# Patient Record
Sex: Male | Born: 1955 | Race: White | Hispanic: No | Marital: Married | Smoking: Never smoker
Health system: Southern US, Community
[De-identification: ages and names within clinical notes are randomized; demographics above are authoritative.]

## PROBLEM LIST (undated history)

## (undated) DIAGNOSIS — R351 Nocturia: Secondary | ICD-10-CM

## (undated) DIAGNOSIS — Z87442 Personal history of urinary calculi: Secondary | ICD-10-CM

## (undated) DIAGNOSIS — N201 Calculus of ureter: Secondary | ICD-10-CM

## (undated) DIAGNOSIS — I10 Essential (primary) hypertension: Secondary | ICD-10-CM

## (undated) DIAGNOSIS — Z85828 Personal history of other malignant neoplasm of skin: Secondary | ICD-10-CM

## (undated) DIAGNOSIS — K219 Gastro-esophageal reflux disease without esophagitis: Secondary | ICD-10-CM

## (undated) DIAGNOSIS — Z9889 Other specified postprocedural states: Secondary | ICD-10-CM

## (undated) DIAGNOSIS — C801 Malignant (primary) neoplasm, unspecified: Secondary | ICD-10-CM

## (undated) DIAGNOSIS — M109 Gout, unspecified: Secondary | ICD-10-CM

## (undated) DIAGNOSIS — M1990 Unspecified osteoarthritis, unspecified site: Secondary | ICD-10-CM

## (undated) DIAGNOSIS — Z8616 Personal history of COVID-19: Secondary | ICD-10-CM

---

## 2016-12-28 ENCOUNTER — Other Ambulatory Visit: Payer: Self-pay | Admitting: Orthopedic Surgery

## 2016-12-28 DIAGNOSIS — M25312 Other instability, left shoulder: Secondary | ICD-10-CM

## 2016-12-28 DIAGNOSIS — S46092A Other injury of muscle(s) and tendon(s) of the rotator cuff of left shoulder, initial encounter: Secondary | ICD-10-CM

## 2016-12-28 DIAGNOSIS — G8911 Acute pain due to trauma: Secondary | ICD-10-CM

## 2016-12-28 DIAGNOSIS — M25512 Pain in left shoulder: Secondary | ICD-10-CM

## 2017-01-11 ENCOUNTER — Ambulatory Visit
Admission: RE | Admit: 2017-01-11 | Discharge: 2017-01-11 | Disposition: A | Discharge status: Home / Self Care | Payer: Managed Care, Other (non HMO) | Source: Ambulatory Visit | Attending: Orthopedic Surgery | Admitting: Orthopedic Surgery

## 2017-01-11 DIAGNOSIS — M25512 Pain in left shoulder: Secondary | ICD-10-CM | POA: Diagnosis not present

## 2017-01-11 DIAGNOSIS — S46012A Strain of muscle(s) and tendon(s) of the rotator cuff of left shoulder, initial encounter: Secondary | ICD-10-CM | POA: Insufficient documentation

## 2017-01-11 DIAGNOSIS — S46092A Other injury of muscle(s) and tendon(s) of the rotator cuff of left shoulder, initial encounter: Secondary | ICD-10-CM | POA: Diagnosis present

## 2017-01-11 DIAGNOSIS — M75122 Complete rotator cuff tear or rupture of left shoulder, not specified as traumatic: Secondary | ICD-10-CM | POA: Diagnosis not present

## 2017-01-11 DIAGNOSIS — G8911 Acute pain due to trauma: Secondary | ICD-10-CM | POA: Insufficient documentation

## 2017-01-11 DIAGNOSIS — M25312 Other instability, left shoulder: Secondary | ICD-10-CM

## 2017-03-28 ENCOUNTER — Inpatient Hospital Stay: Admission: RE | Admit: 2017-03-28 | Payer: Managed Care, Other (non HMO) | Source: Ambulatory Visit

## 2017-03-29 ENCOUNTER — Inpatient Hospital Stay: Admission: RE | Admit: 2017-03-29 | Payer: Managed Care, Other (non HMO) | Source: Ambulatory Visit

## 2017-04-01 ENCOUNTER — Encounter
Admission: RE | Admit: 2017-04-01 | Discharge: 2017-04-01 | Disposition: A | Discharge status: Home / Self Care | Payer: Managed Care, Other (non HMO) | Source: Ambulatory Visit | Attending: Surgery | Admitting: Surgery

## 2017-04-01 HISTORY — DX: Unspecified osteoarthritis, unspecified site: M19.90

## 2017-04-01 HISTORY — DX: Personal history of urinary calculi: Z87.442

## 2017-04-01 HISTORY — DX: Malignant (primary) neoplasm, unspecified: C80.1

## 2017-04-01 HISTORY — DX: Essential (primary) hypertension: I10

## 2017-04-01 NOTE — Patient Instructions (Signed)
  Your procedure is scheduled on: 04-04-17 THURSDAY Report to Same Day Surgery 2nd floor medical mall [facility] Entrance-take elevator on left to 2nd floor.  Check in with surgery information desk.) To find out your arrival time please call [phone] between 1PM - 3PM on 04-03-17 [facility]  Remember: Instructions that are not followed completely may result in serious medical risk, up to and including death, or upon the discretion of your surgeon and anesthesiologist your surgery may need to be rescheduled.    _x___ 1. Do not eat food or drink liquids after midnight. No gum chewing or hard candies.     __x__ 2. No Alcohol for 24 hours before or after surgery.   __x__3. No Smoking for 24 prior to surgery.   ____  4. Bring all medications with you on the day of surgery if instructed.    __x__ 5. Notify your doctor if there is any change in your medical condition     (cold, fever, infections).     Do not wear jewelry, make-up, hairpins, clips or nail polish.  Do not wear lotions, powders, or perfumes. You may wear deodorant.  Do not shave 48 hours prior to surgery. Men may shave face and neck.  Do not bring valuables to the hospital.    [facility] is not responsible for any belongings or valuables.               Contacts, dentures or bridgework may not be worn into surgery.  Leave your suitcase in the car. After surgery it may be brought to your room.  For patients admitted to the hospital, discharge time is determined by your treatment team.   Patients discharged the day of surgery will not be allowed to drive home.  You will need someone to drive you home and stay with you the night of your procedure.    Please read over the following fact sheets that you were given:     _x___ [place] WITH A SMALL SIP OF WATER. These include:  1. LISINOPRIL  2.  3.  4.  5.  6.  ____Fleets enema or Magnesium Citrate as directed.   _x___ Use  CHG Soap or sage wipes as directed on instruction sheet   ____ Use inhalers on the day of surgery and bring to hospital day of surgery  ____ Stop Metformin and Janumet 2 days prior to surgery.    ____ Take 1/2 of usual insulin dose the night before surgery and none on the morning surgery.   ____ Follow recommendations from Cardiologist, Pulmonologist or PCP regarding stopping Aspirin, Coumadin, Pllavix ,Eliquis, Effient, or Pradaxa, and Pletal.  X____Stop Anti-inflammatories such as Advil, Aleve, IBUPROFEN, Motrin, Naproxen, Naprosyn, Goodies powders or aspirin products NOW-OK to take Tylenol    ____ Stop supplements until after surgery.     ____ Bring C-Pap to the hospital.

## 2017-04-02 ENCOUNTER — Encounter
Admission: RE | Admit: 2017-04-02 | Discharge: 2017-04-02 | Disposition: A | Discharge status: Home / Self Care | Payer: Managed Care, Other (non HMO) | Source: Ambulatory Visit | Attending: Surgery | Admitting: Surgery

## 2017-04-02 DIAGNOSIS — Z85828 Personal history of other malignant neoplasm of skin: Secondary | ICD-10-CM | POA: Diagnosis not present

## 2017-04-02 DIAGNOSIS — W1809XA Striking against other object with subsequent fall, initial encounter: Secondary | ICD-10-CM | POA: Diagnosis not present

## 2017-04-02 DIAGNOSIS — Y9389 Activity, other specified: Secondary | ICD-10-CM | POA: Diagnosis not present

## 2017-04-02 DIAGNOSIS — Z91013 Allergy to seafood: Secondary | ICD-10-CM | POA: Diagnosis not present

## 2017-04-02 DIAGNOSIS — M65812 Other synovitis and tenosynovitis, left shoulder: Secondary | ICD-10-CM | POA: Diagnosis not present

## 2017-04-02 DIAGNOSIS — I10 Essential (primary) hypertension: Secondary | ICD-10-CM | POA: Diagnosis not present

## 2017-04-02 DIAGNOSIS — Y92003 Bedroom of unspecified non-institutional (private) residence as the place of occurrence of the external cause: Secondary | ICD-10-CM | POA: Diagnosis not present

## 2017-04-02 DIAGNOSIS — S46012A Strain of muscle(s) and tendon(s) of the rotator cuff of left shoulder, initial encounter: Secondary | ICD-10-CM | POA: Diagnosis not present

## 2017-04-02 DIAGNOSIS — M7542 Impingement syndrome of left shoulder: Secondary | ICD-10-CM | POA: Diagnosis present

## 2017-04-02 DIAGNOSIS — Y998 Other external cause status: Secondary | ICD-10-CM | POA: Diagnosis not present

## 2017-04-02 DIAGNOSIS — Z79899 Other long term (current) drug therapy: Secondary | ICD-10-CM | POA: Diagnosis not present

## 2017-04-02 DIAGNOSIS — M7522 Bicipital tendinitis, left shoulder: Secondary | ICD-10-CM | POA: Diagnosis not present

## 2017-04-02 DIAGNOSIS — Z87442 Personal history of urinary calculi: Secondary | ICD-10-CM | POA: Diagnosis not present

## 2017-04-03 MED ORDER — CEFAZOLIN SODIUM-DEXTROSE 2-4 GM/100ML-% IV SOLN
2.0000 g | Freq: Once | INTRAVENOUS | Status: AC
  Administered 2017-04-04: 2 g via INTRAVENOUS

## 2017-04-04 ENCOUNTER — Encounter: Payer: Self-pay | Admitting: Certified Registered Nurse Anesthetist

## 2017-04-04 ENCOUNTER — Encounter
Admission: RE | Disposition: A | Discharge status: Home / Self Care | Payer: Self-pay | Source: Ambulatory Visit | Attending: Surgery

## 2017-04-04 ENCOUNTER — Ambulatory Visit
Admission: RE | Admit: 2017-04-04 | Discharge: 2017-04-04 | Disposition: A | Discharge status: Home / Self Care | Payer: Managed Care, Other (non HMO) | Source: Ambulatory Visit | Attending: Surgery | Admitting: Surgery

## 2017-04-04 ENCOUNTER — Ambulatory Visit: Payer: Managed Care, Other (non HMO) | Admitting: Certified Registered Nurse Anesthetist

## 2017-04-04 DIAGNOSIS — Z79899 Other long term (current) drug therapy: Secondary | ICD-10-CM | POA: Insufficient documentation

## 2017-04-04 DIAGNOSIS — Z91013 Allergy to seafood: Secondary | ICD-10-CM | POA: Insufficient documentation

## 2017-04-04 DIAGNOSIS — Z87442 Personal history of urinary calculi: Secondary | ICD-10-CM | POA: Insufficient documentation

## 2017-04-04 DIAGNOSIS — Y998 Other external cause status: Secondary | ICD-10-CM | POA: Insufficient documentation

## 2017-04-04 DIAGNOSIS — W1809XA Striking against other object with subsequent fall, initial encounter: Secondary | ICD-10-CM | POA: Insufficient documentation

## 2017-04-04 DIAGNOSIS — M65812 Other synovitis and tenosynovitis, left shoulder: Secondary | ICD-10-CM | POA: Insufficient documentation

## 2017-04-04 DIAGNOSIS — Y92003 Bedroom of unspecified non-institutional (private) residence as the place of occurrence of the external cause: Secondary | ICD-10-CM | POA: Insufficient documentation

## 2017-04-04 DIAGNOSIS — S46012A Strain of muscle(s) and tendon(s) of the rotator cuff of left shoulder, initial encounter: Secondary | ICD-10-CM | POA: Insufficient documentation

## 2017-04-04 DIAGNOSIS — Y9389 Activity, other specified: Secondary | ICD-10-CM | POA: Insufficient documentation

## 2017-04-04 DIAGNOSIS — M7542 Impingement syndrome of left shoulder: Secondary | ICD-10-CM | POA: Insufficient documentation

## 2017-04-04 DIAGNOSIS — M7522 Bicipital tendinitis, left shoulder: Secondary | ICD-10-CM | POA: Insufficient documentation

## 2017-04-04 DIAGNOSIS — Z85828 Personal history of other malignant neoplasm of skin: Secondary | ICD-10-CM | POA: Insufficient documentation

## 2017-04-04 DIAGNOSIS — I10 Essential (primary) hypertension: Secondary | ICD-10-CM | POA: Insufficient documentation

## 2017-04-04 HISTORY — PX: SHOULDER ARTHROSCOPY WITH OPEN ROTATOR CUFF REPAIR: SHX6092

## 2017-04-04 HISTORY — PX: BICEPT TENODESIS: SHX5116

## 2017-04-04 SURGERY — ARTHROSCOPY, SHOULDER WITH REPAIR, ROTATOR CUFF, OPEN
Anesthesia: General | Site: Shoulder | Laterality: Left | Wound class: Clean

## 2017-04-04 MED ORDER — LIDOCAINE HCL (PF) 2 % IJ SOLN
INTRAMUSCULAR | Status: AC
  Filled 2017-04-04: qty 2

## 2017-04-04 MED ORDER — ACETAMINOPHEN 10 MG/ML IV SOLN
INTRAVENOUS | Status: DC | PRN
  Administered 2017-04-04: 1000 mg via INTRAVENOUS

## 2017-04-04 MED ORDER — ONDANSETRON HCL 4 MG/2ML IJ SOLN
INTRAMUSCULAR | Status: DC | PRN
  Administered 2017-04-04: 4 mg via INTRAVENOUS

## 2017-04-04 MED ORDER — BUPIVACAINE-EPINEPHRINE (PF) 0.5% -1:200000 IJ SOLN
INTRAMUSCULAR | Status: AC
  Filled 2017-04-04: qty 30

## 2017-04-04 MED ORDER — SUCCINYLCHOLINE CHLORIDE 20 MG/ML IJ SOLN
INTRAMUSCULAR | Status: DC | PRN
  Administered 2017-04-04: 100 mg via INTRAVENOUS

## 2017-04-04 MED ORDER — LACTATED RINGERS IV SOLN
INTRAVENOUS | Status: DC
  Administered 2017-04-04: 09:00:00 via INTRAVENOUS

## 2017-04-04 MED ORDER — PROPOFOL 10 MG/ML IV BOLUS
INTRAVENOUS | Status: DC | PRN
Start: 2017-04-04 — End: 2017-04-04
  Administered 2017-04-04: 150 mg via INTRAVENOUS
  Administered 2017-04-04: 50 mg via INTRAVENOUS

## 2017-04-04 MED ORDER — EPINEPHRINE PF 1 MG/ML IJ SOLN
INTRAMUSCULAR | Status: AC
  Filled 2017-04-04: qty 2

## 2017-04-04 MED ORDER — LIDOCAINE HCL (PF) 1 % IJ SOLN
INTRAMUSCULAR | Status: AC
  Filled 2017-04-04: qty 5

## 2017-04-04 MED ORDER — ONDANSETRON HCL 4 MG/2ML IJ SOLN: 4.0000 mg | Freq: Once | INTRAMUSCULAR | Status: DC | PRN

## 2017-04-04 MED ORDER — ACETAMINOPHEN 10 MG/ML IV SOLN
INTRAVENOUS | Status: AC
  Filled 2017-04-04: qty 100

## 2017-04-04 MED ORDER — ROCURONIUM BROMIDE 50 MG/5ML IV SOLN
INTRAVENOUS | Status: AC
  Filled 2017-04-04: qty 1

## 2017-04-04 MED ORDER — OXYCODONE HCL 5 MG PO TABS: 5.0000 mg | ORAL_TABLET | ORAL | 0 refills | Status: DC | PRN

## 2017-04-04 MED ORDER — PHENYLEPHRINE HCL 10 MG/ML IJ SOLN
INTRAMUSCULAR | Status: DC | PRN
  Administered 2017-04-04 (×3): 100 ug via INTRAVENOUS

## 2017-04-04 MED ORDER — SUGAMMADEX SODIUM 200 MG/2ML IV SOLN
INTRAVENOUS | Status: DC | PRN
  Administered 2017-04-04: 250 mg via INTRAVENOUS

## 2017-04-04 MED ORDER — LIDOCAINE HCL (CARDIAC) 20 MG/ML IV SOLN
INTRAVENOUS | Status: DC | PRN
  Administered 2017-04-04: 80 mg via INTRAVENOUS

## 2017-04-04 MED ORDER — POTASSIUM CHLORIDE IN NACL 20-0.9 MEQ/L-% IV SOLN
INTRAVENOUS | Status: DC
  Filled 2017-04-04: qty 1000

## 2017-04-04 MED ORDER — ONDANSETRON HCL 4 MG/2ML IJ SOLN
INTRAMUSCULAR | Status: AC
  Filled 2017-04-04: qty 2

## 2017-04-04 MED ORDER — BUPIVACAINE-EPINEPHRINE 0.5% -1:200000 IJ SOLN
INTRAMUSCULAR | Status: DC | PRN
  Administered 2017-04-04: 30 mL

## 2017-04-04 MED ORDER — ONDANSETRON HCL 4 MG/2ML IJ SOLN: 4.0000 mg | Freq: Four times a day (QID) | INTRAMUSCULAR | Status: DC | PRN

## 2017-04-04 MED ORDER — DEXAMETHASONE SODIUM PHOSPHATE 10 MG/ML IJ SOLN
INTRAMUSCULAR | Status: DC | PRN
  Administered 2017-04-04: 10 mg via INTRAVENOUS

## 2017-04-04 MED ORDER — ONDANSETRON HCL 4 MG PO TABS: 4.0000 mg | ORAL_TABLET | Freq: Four times a day (QID) | ORAL | Status: DC | PRN

## 2017-04-04 MED ORDER — FAMOTIDINE 20 MG PO TABS
20.0000 mg | ORAL_TABLET | Freq: Once | ORAL | Status: AC
  Administered 2017-04-04: 20 mg via ORAL

## 2017-04-04 MED ORDER — SODIUM CHLORIDE 0.9 % IV SOLN
INTRAVENOUS | Status: DC | PRN
  Administered 2017-04-04: 20 ug/min via INTRAVENOUS

## 2017-04-04 MED ORDER — OXYCODONE HCL 5 MG PO TABS
5.0000 mg | ORAL_TABLET | ORAL | Status: DC | PRN
  Filled 2017-04-04: qty 2

## 2017-04-04 MED ORDER — MIDAZOLAM HCL 2 MG/2ML IJ SOLN
2.0000 mg | Freq: Once | INTRAMUSCULAR | Status: AC
  Administered 2017-04-04: 2 mg via INTRAVENOUS

## 2017-04-04 MED ORDER — FENTANYL CITRATE (PF) 100 MCG/2ML IJ SOLN: 25.0000 ug | INTRAMUSCULAR | Status: DC | PRN

## 2017-04-04 MED ORDER — DEXAMETHASONE SODIUM PHOSPHATE 10 MG/ML IJ SOLN
INTRAMUSCULAR | Status: AC
  Filled 2017-04-04: qty 1

## 2017-04-04 MED ORDER — FENTANYL CITRATE (PF) 100 MCG/2ML IJ SOLN
INTRAMUSCULAR | Status: DC | PRN
  Administered 2017-04-04: 50 ug via INTRAVENOUS
  Administered 2017-04-04 (×2): 25 ug via INTRAVENOUS

## 2017-04-04 MED ORDER — ROPIVACAINE HCL 5 MG/ML IJ SOLN
INTRAMUSCULAR | Status: AC
  Filled 2017-04-04: qty 60

## 2017-04-04 MED ORDER — MIDAZOLAM HCL 2 MG/2ML IJ SOLN
INTRAMUSCULAR | Status: AC
  Administered 2017-04-04: 2 mg via INTRAVENOUS
  Filled 2017-04-04: qty 2

## 2017-04-04 MED ORDER — METOCLOPRAMIDE HCL 5 MG/ML IJ SOLN: 5.0000 mg | Freq: Three times a day (TID) | INTRAMUSCULAR | Status: DC | PRN

## 2017-04-04 MED ORDER — FAMOTIDINE 20 MG PO TABS
ORAL_TABLET | ORAL | Status: AC
  Filled 2017-04-04: qty 1

## 2017-04-04 MED ORDER — METOCLOPRAMIDE HCL 10 MG PO TABS: 5.0000 mg | ORAL_TABLET | Freq: Three times a day (TID) | ORAL | Status: DC | PRN

## 2017-04-04 MED ORDER — ROCURONIUM BROMIDE 100 MG/10ML IV SOLN
INTRAVENOUS | Status: DC | PRN
  Administered 2017-04-04: 25 mg via INTRAVENOUS
  Administered 2017-04-04 (×3): 10 mg via INTRAVENOUS
  Administered 2017-04-04: 5 mg via INTRAVENOUS

## 2017-04-04 MED ORDER — CEFAZOLIN SODIUM-DEXTROSE 2-4 GM/100ML-% IV SOLN
INTRAVENOUS | Status: AC
  Filled 2017-04-04: qty 100

## 2017-04-04 MED ORDER — PROPOFOL 10 MG/ML IV BOLUS
INTRAVENOUS | Status: AC
  Filled 2017-04-04: qty 20

## 2017-04-04 MED ORDER — FENTANYL CITRATE (PF) 100 MCG/2ML IJ SOLN
INTRAMUSCULAR | Status: DC
Start: 2017-04-04 — End: 2017-04-04
  Filled 2017-04-04: qty 2

## 2017-04-04 MED ORDER — EPINEPHRINE PF 1 MG/ML IJ SOLN
INTRAMUSCULAR | Status: DC | PRN
  Administered 2017-04-04: 2 mL

## 2017-04-04 MED ORDER — FENTANYL CITRATE (PF) 100 MCG/2ML IJ SOLN
INTRAMUSCULAR | Status: AC
  Filled 2017-04-04: qty 2

## 2017-04-04 SURGICAL SUPPLY — 44 items
ANCHOR JUGGERKNOT WTAP NDL 2.9 (Anchor) ×4 IMPLANT
BIT DRILL JUGRKNT W/NDL BIT2.9 (DRILL) ×1 IMPLANT
BLADE FULL RADIUS 3.5 (BLADE) ×2 IMPLANT
BUR ACROMIONIZER 4.0 (BURR) ×2 IMPLANT
CANNULA SHAVER 8MMX76MM (CANNULA) ×2 IMPLANT
CHLORAPREP W/TINT 26ML (MISCELLANEOUS) ×2 IMPLANT
COVER MAYO STAND STRL (DRAPES) ×2 IMPLANT
DRAPE IMP U-DRAPE 54X76 (DRAPES) ×4 IMPLANT
DRILL JUGGERKNOT W/NDL BIT 2.9 (DRILL) ×2
DRSG OPSITE POSTOP 4X8 (GAUZE/BANDAGES/DRESSINGS) ×2 IMPLANT
ELECT REM PT RETURN 9FT ADLT (ELECTROSURGICAL) ×2
ELECTRODE REM PT RTRN 9FT ADLT (ELECTROSURGICAL) ×1 IMPLANT
GAUZE PETRO XEROFOAM 1X8 (MISCELLANEOUS) ×2 IMPLANT
GAUZE SPONGE 4X4 12PLY STRL (GAUZE/BANDAGES/DRESSINGS) ×2 IMPLANT
GLOVE BIO SURGEON STRL SZ7.5 (GLOVE) ×4 IMPLANT
GLOVE BIO SURGEON STRL SZ8 (GLOVE) ×4 IMPLANT
GLOVE BIOGEL PI IND STRL 8 (GLOVE) ×1 IMPLANT
GLOVE BIOGEL PI INDICATOR 8 (GLOVE) ×1
GLOVE INDICATOR 8.0 STRL GRN (GLOVE) ×2 IMPLANT
GOWN STRL REUS W/ TWL LRG LVL3 (GOWN DISPOSABLE) ×1 IMPLANT
GOWN STRL REUS W/ TWL XL LVL3 (GOWN DISPOSABLE) ×1 IMPLANT
GOWN STRL REUS W/TWL LRG LVL3 (GOWN DISPOSABLE) ×1
GOWN STRL REUS W/TWL XL LVL3 (GOWN DISPOSABLE) ×1
GRASPER SUT 15 45D LOW PRO (SUTURE) IMPLANT
IV LACTATED RINGER IRRG 3000ML (IV SOLUTION) ×2
IV LR IRRIG 3000ML ARTHROMATIC (IV SOLUTION) ×2 IMPLANT
MANIFOLD NEPTUNE II (INSTRUMENTS) ×2 IMPLANT
MASK FACE SPIDER DISP (MASK) ×2 IMPLANT
MAT BLUE FLOOR 46X72 FLO (MISCELLANEOUS) ×2 IMPLANT
NDL MAYO CATGUT SZ5 (NEEDLE)
NDL SUT 5 .5 CRC TPR PNT MAYO (NEEDLE) IMPLANT
NEEDLE REVERSE CUT 1/2 CRC (NEEDLE) IMPLANT
PACK ARTHROSCOPY SHOULDER (MISCELLANEOUS) ×2 IMPLANT
SLING ARM LRG DEEP (SOFTGOODS) ×2 IMPLANT
SLING ULTRA II LG (MISCELLANEOUS) ×2 IMPLANT
STAPLER SKIN PROX 35W (STAPLE) ×2 IMPLANT
STRAP SAFETY BODY (MISCELLANEOUS) ×2 IMPLANT
SUT ETHIBOND 0 MO6 C/R (SUTURE) ×2 IMPLANT
SUT VIC AB 2-0 CT1 27 (SUTURE) ×2
SUT VIC AB 2-0 CT1 TAPERPNT 27 (SUTURE) ×2 IMPLANT
TAPE MICROFOAM 4IN (TAPE) ×2 IMPLANT
TUBING ARTHRO INFLOW-ONLY STRL (TUBING) ×2 IMPLANT
TUBING CONNECTING 10 (TUBING) ×2 IMPLANT
WAND HAND CNTRL MULTIVAC 90 (MISCELLANEOUS) ×2 IMPLANT

## 2017-04-04 NOTE — H&P (Signed)
Paper H&P to be scanned into permanent record. H&P reviewed and patient re-examined. No changes. 

## 2017-04-04 NOTE — OR Nursing (Signed)
10:20 to PACU for block

## 2017-04-04 NOTE — Transfer of Care (Signed)
Immediate Anesthesia Transfer of Care Note  Patient: Carlos Mcclain  Procedure(s) Performed: Procedure(s): SHOULDER ARTHROSCOPY WITH OPEN ROTATOR CUFF REPAIR, decompression and debridement (Left) BICEPS TENODESIS (Left)  Patient Location: PACU  Anesthesia Type:General  Level of Consciousness: sedated  Airway & Oxygen Therapy: Patient Spontanous Breathing and Patient connected to face mask oxygen  Post-op Assessment: Report given to RN and Post -op Vital signs reviewed and stable  Post vital signs: Reviewed and stable  Last Vitals:  Vitals:   04/04/17 1115 04/04/17 1125  BP: (!) 124/93 138/89  Pulse: 72 66  Resp: 16 15  Temp:      Last Pain:  Vitals:   04/04/17 1054  TempSrc:   PainSc: 0-No pain         Complications: No apparent anesthesia complications

## 2017-04-04 NOTE — Anesthesia Preprocedure Evaluation (Signed)
Anesthesia Evaluation  Patient identified by MRN, date of birth, ID band Patient awake    Reviewed: Allergy & Precautions, H&P , NPO status , Patient's Chart, lab work & pertinent test results, reviewed documented beta blocker date and time   Airway Mallampati: II  TM Distance: >3 FB Neck ROM: full    Dental  (+) Teeth Intact   Pulmonary neg pulmonary ROS,    Pulmonary exam normal        Cardiovascular hypertension, negative cardio ROS Normal cardiovascular exam Rhythm:regular Rate:Normal     Neuro/Psych negative neurological ROS  negative psych ROS   GI/Hepatic negative GI ROS, Neg liver ROS,   Endo/Other  negative endocrine ROS  Renal/GU negative Renal ROS  negative genitourinary   Musculoskeletal   Abdominal   Peds  Hematology negative hematology ROS (+)   Anesthesia Other Findings Past Medical History: No date: Arthritis No date: Cancer (HCC)     Comment:  BASAL CELL No date: History of kidney stones     Comment:  H/O No date: Hypertension History reviewed. No pertinent surgical history. BMI    Body Mass Index:  33.72 kg/m     Reproductive/Obstetrics negative OB ROS                             Anesthesia Physical Anesthesia Plan  ASA: II  Anesthesia Plan: General ETT   Post-op Pain Management:  Regional for Post-op pain   Induction:   PONV Risk Score and Plan:   Airway Management Planned:   Additional Equipment:   Intra-op Plan:   Post-operative Plan:   Informed Consent: I have reviewed the patients History and Physical, chart, labs and discussed the procedure including the risks, benefits and alternatives for the proposed anesthesia with the patient or authorized representative who has indicated his/her understanding and acceptance.   Dental Advisory Given  Plan Discussed with: CRNA  Anesthesia Plan Comments:         Anesthesia Quick Evaluation

## 2017-04-04 NOTE — Anesthesia Post-op Follow-up Note (Cosign Needed)
Anesthesia QCDR form completed.        

## 2017-04-04 NOTE — Anesthesia Procedure Notes (Signed)
Procedure Name: Intubation Date/Time: 04/04/2017 11:38 AM Performed by: Johnna Acosta Pre-anesthesia Checklist: Patient identified, Emergency Drugs available, Suction available, Patient being monitored and Timeout performed Patient Re-evaluated:Patient Re-evaluated prior to induction Oxygen Delivery Method: Circle system utilized Preoxygenation: Pre-oxygenation with 100% oxygen Induction Type: IV induction Ventilation: Mask ventilation with difficulty and Oral airway inserted - appropriate to patient size Laryngoscope Size: McGraph and 3 Grade View: Grade II Tube type: Oral Tube size: 7.5 mm Number of attempts: 1 Airway Equipment and Method: Stylet Placement Confirmation: ETT inserted through vocal cords under direct vision,  positive ETCO2 and breath sounds checked- equal and bilateral Secured at: 22 cm Tube secured with: Tape Dental Injury: Teeth and Oropharynx as per pre-operative assessment  Difficulty Due To: Difficulty was anticipated, Difficult Airway- due to limited oral opening and Difficult Airway- due to large tongue Future Recommendations: Recommend- induction with short-acting agent, and alternative techniques readily available

## 2017-04-04 NOTE — Op Note (Signed)
04/04/2017  1:36 PM  Patient:   Carlos Mcclain  Pre-Op Diagnosis:   Impingement/tendinopathy with large rotator cuff tear, left shoulder.  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  Procedure: Limited arthroscopic debridement, arthroscopic subacromial decompression, mini-open partial rotator cuff repair, and mini-open biceps tenodesis, left shoulder.  Anesthesia: General endotracheal with interscalene block placed preoperatively by the anesthesiologist.  Surgeon:   Pascal Lux, MD  Assistant:   Cameron Proud, PA-C  Findings: As above. There was mild labral fraying anteriorly and superiorly with no detachment from the glenoid. The biceps tendon demonstrated mild to moderate tendinopathy changes. The articular surfaces of the glenoid and humerus both were in excellent condition. The rotator cuff tear involved the entire supraspinatus and supraspinatus tendons with retraction back to the glenoid.  Complications: None  Fluids:   900 cc  Estimated blood loss: 10 cc  Tourniquet time: None  Drains: None  Closure: Staples   Brief clinical note: The patient is a 61 year old male with a history of left shoulder pain and weakness following a fall. The patient's symptoms have progressed despite medications, activity modification, etc. The patient's history and examination are consistent with impingement/tendinopathy with a large rotator cuff tear. These findings were confirmed by MRI scan. The patient presents at this time for definitive management of these shoulder symptoms.  Procedure: The patient underwent placement of an interscalene block by the anesthesiologist in the preoperative holding area before being brought into the operating room and lain in the supine position. The patient then underwent general endotracheal intubation and anesthesia before being repositioned in the  beach chair position using the beach chair positioner. The left shoulder and upper extremity were prepped with ChloraPrep solution before being draped sterilely. Preoperative antibiotics were administered. A timeout was performed to confirm the proper surgical site before the expected portal sites and incision site were injected with 0.5% Sensorcaine with epinephrine. A posterior portal was created and the glenohumeral joint thoroughly inspected with the findings as described above. An anterior portal was created using an outside-in technique. The labrum and rotator cuff were further probed, again confirming the above-noted findings. The areas of labral fraying and synovitis were debrided back to stable margins using the full-radius resector. The ArthroCare wand was inserted and used to obtain hemostasis as well as to "anneal" the labrum superiorly and anteriorly. It was elected to leave the biceps anchor intact so that we could use the biceps to supplement the rotator cuff repair or to use it as a superior capsular reconstructive tissue. The instruments were removed from the joint after suctioning the excess fluid.  The camera was repositioned through the posterior portal into the subacromial space. A separate lateral portal was created using an outside-in technique. The 3.5 mm full-radius resector was introduced and used to perform a subtotal bursectomy. The ArthroCare wand was then inserted and used to remove the periosteal tissue off the undersurface of the anterior third of the acromion as well as to recess the coracoacromial ligament from its attachment along the anterior and lateral margins of the acromion. The 4.0 mm acromionizing bur was introduced and used to complete the decompression by removing the undersurface of the anterior third of the acromion. The full radius resector was reintroduced to remove any residual bony debris before the ArthroCare wand was reintroduced to obtain hemostasis. The  instruments were then removed from the subacromial space after suctioning the excess fluid.  An approximately 4-5 cm incision was made  over the anterolateral aspect of the shoulder beginning at the anterolateral corner of the acromion and extending distally in line with the bicipital groove. This incision was carried down through the subcutaneous tissues to expose the deltoid fascia. The raphae between the anterior and middle thirds was identified and this plane developed to provide access into the subacromial space. Additional bursal tissues were debrided sharply using Metzenbaum scissors. The rotator cuff tear was readily identified. Extensive efforts were made to mobilize the rotator cuff tissues anteriorly and posteriorly, and medially with limited success. Despite these efforts, the rotator cuff could not be fully mobilized. Therefore, the proximal portion of the biceps tendon was released at the mid groove and brought laterally over the anterior portion of the greater tuberosity. It was secured using a single Biomet 2.9 mm JuggerKnot anchor. The anterior portion of the rotator cuff was sutured to this biceps tendon using #0 Ethibond interrupted sutures. In addition, the lateral portion of the posterior margin of the rotator cuff was able to be advanced sufficiently to reach the eye sepsis tendon. This also was reattached to the biceps using #0 Ethibond interrupted sutures. Unfortunately, the remove remaining portion of the defect could not be reapproximated.  The bicipital groove was identified by palpation and opened for 1-1.5 cm. The floor of the bicipital groove was roughened with a curet before a second Biomet 2.9 mm JuggerKnot anchor was inserted. Both sets of sutures were passed through the biceps tendon and tied securely to effect the tenodesis. The bicipital sheath was reapproximated using another #0 Ethibond interrupted suture, incorporating the biceps tendon to further reinforce the  tenodesis.  The wound was copiously irrigated with sterile saline solution before the deltoid raphae was reapproximated using 2-0 Vicryl interrupted sutures. The subcutaneous tissues were closed in two layers using 2-0 Vicryl interrupted sutures before the skin was closed using staples. The portal sites also were closed using staples. A sterile bulky dressing was applied to the shoulder before the arm was placed into a shoulder immobilizer. The patient was then awakened, extubated, and returned to the recovery room in satisfactory condition after tolerating the procedure well.

## 2017-04-04 NOTE — Discharge Instructions (Addendum)
Keep dressing dry and intact.  May sponge bathe after dressing changed on post-op day #4 (Monday).  Cover staples with Band-Aids after drying off. Apply ice frequently to shoulder. Take ibuprofen 800 mg TID with meals for 7-10 days, then as necessary. Take oxycodone as prescribed when needed.  May supplement with ES Tylenol if necessary. Keep shoulder immobilizer on at all times except may loosen for bathing purposes. Follow-up in 10-14 days or as scheduled.     AMBULATORY SURGERY  DISCHARGE INSTRUCTIONS   1) The drugs that you were given will stay in your system until tomorrow so for the next 24 hours you should not:  A) Drive an automobile B) Make any legal decisions C) Drink any alcoholic beverage   2) You may resume regular meals tomorrow.  Today it is better to start with liquids and gradually work up to solid foods.  You may eat anything you prefer, but it is better to start with liquids, then soup and crackers, and gradually work up to solid foods.   3) Please notify your doctor immediately if you have any unusual bleeding, trouble breathing, redness and pain at the surgery site, drainage, fever, or pain not relieved by medication.    4) Additional Instructions:        Please contact your physician with any problems or Same Day Surgery at [phone], Monday through Friday 6 am to 4 pm, or [place] at [facility] number at [phone].

## 2017-04-05 NOTE — Anesthesia Postprocedure Evaluation (Signed)
Anesthesia Post Note  Patient: Carlos Mcclain  Procedure(s) Performed: Procedure(s) (LRB): SHOULDER ARTHROSCOPY WITH OPEN ROTATOR CUFF REPAIR, decompression and debridement (Left) BICEPS TENODESIS (Left)  Patient location during evaluation: PACU Anesthesia Type: General Level of consciousness: awake and alert Pain management: pain level controlled Vital Signs Assessment: post-procedure vital signs reviewed and stable Respiratory status: spontaneous breathing, nonlabored ventilation, respiratory function stable and patient connected to nasal cannula oxygen Cardiovascular status: blood pressure returned to baseline and stable Postop Assessment: no signs of nausea or vomiting Anesthetic complications: no     Last Vitals:  Vitals:   04/04/17 1451 04/04/17 1507  BP: 117/70 133/74  Pulse: 74 84  Resp: 14 16  Temp:      Last Pain:  Vitals:   04/04/17 1451  TempSrc:   PainSc: 0-No pain                 Molli Barrows

## 2019-08-17 ENCOUNTER — Other Ambulatory Visit: Payer: Self-pay

## 2019-08-17 DIAGNOSIS — Z20822 Contact with and (suspected) exposure to covid-19: Secondary | ICD-10-CM

## 2019-08-19 LAB — NOVEL CORONAVIRUS, NAA: SARS-CoV-2, NAA: DETECTED — AB

## 2019-08-21 ENCOUNTER — Telehealth: Payer: Self-pay | Admitting: Nurse Practitioner

## 2019-08-21 NOTE — Telephone Encounter (Signed)
Called to Discuss with patient about Covid symptoms and the use of bamlanivimab, a monoclonal antibody infusion for those with mild to moderate Covid symptoms and at a high risk of hospitalization.     Pt is qualified for this infusion at the [facility] infusion center due to co-morbid conditions and/or a member of an at-risk group.     Patient is being managed for hypertension.  Unable to reach pt

## 2020-02-23 ENCOUNTER — Other Ambulatory Visit: Payer: Self-pay

## 2020-02-23 ENCOUNTER — Emergency Department
Admission: EM | Admit: 2020-02-23 | Discharge: 2020-02-23 | Disposition: A | Discharge status: Home / Self Care | Payer: Managed Care, Other (non HMO) | Attending: Emergency Medicine | Admitting: Emergency Medicine

## 2020-02-23 ENCOUNTER — Emergency Department: Payer: Managed Care, Other (non HMO)

## 2020-02-23 DIAGNOSIS — R1031 Right lower quadrant pain: Secondary | ICD-10-CM | POA: Diagnosis present

## 2020-02-23 DIAGNOSIS — R112 Nausea with vomiting, unspecified: Secondary | ICD-10-CM | POA: Diagnosis not present

## 2020-02-23 DIAGNOSIS — Z79899 Other long term (current) drug therapy: Secondary | ICD-10-CM | POA: Diagnosis not present

## 2020-02-23 DIAGNOSIS — I10 Essential (primary) hypertension: Secondary | ICD-10-CM | POA: Diagnosis not present

## 2020-02-23 DIAGNOSIS — N200 Calculus of kidney: Secondary | ICD-10-CM | POA: Diagnosis not present

## 2020-02-23 LAB — URINALYSIS, COMPLETE (UACMP) WITH MICROSCOPIC
Bacteria, UA: NONE SEEN
Bilirubin Urine: NEGATIVE
Glucose, UA: NEGATIVE mg/dL
Ketones, ur: NEGATIVE mg/dL
Leukocytes,Ua: NEGATIVE
Nitrite: NEGATIVE
Protein, ur: 100 mg/dL — AB
RBC / HPF: >50 RBC/hpf — ABNORMAL HIGH (ref 0–5)
Specific Gravity, Urine: 1.023 (ref 1.005–1.030)
pH: 5 (ref 5.0–8.0)

## 2020-02-23 LAB — COMPREHENSIVE METABOLIC PANEL
ALT: 26 U/L (ref 0–44)
AST: 20 U/L (ref 15–41)
Albumin: 4.5 g/dL (ref 3.5–5.0)
Alkaline Phosphatase: 86 U/L (ref 38–126)
Anion gap: 12 (ref 5–15)
BUN: 22 mg/dL (ref 8–23)
CO2: 23 mmol/L (ref 22–32)
Calcium: 10.1 mg/dL (ref 8.9–10.3)
Chloride: 106 mmol/L (ref 98–111)
Creatinine, Ser: 1.71 mg/dL — ABNORMAL HIGH (ref 0.61–1.24)
GFR calc Af Amer: 48 mL/min — ABNORMAL LOW (ref >60)
GFR calc non Af Amer: 42 mL/min — ABNORMAL LOW (ref >60)
Glucose, Bld: 152 mg/dL — ABNORMAL HIGH (ref 70–99)
Potassium: 4.3 mmol/L (ref 3.5–5.1)
Sodium: 141 mmol/L (ref 135–145)
Total Bilirubin: 1.4 mg/dL — ABNORMAL HIGH (ref 0.3–1.2)
Total Protein: 7.6 g/dL (ref 6.5–8.1)

## 2020-02-23 LAB — CBC
HCT: 46.1 % (ref 39.0–52.0)
Hemoglobin: 16.3 g/dL (ref 13.0–17.0)
MCH: 30.2 pg (ref 26.0–34.0)
MCHC: 35.4 g/dL (ref 30.0–36.0)
MCV: 85.5 fL (ref 80.0–100.0)
Platelets: 280 10*3/uL (ref 150–400)
RBC: 5.39 MIL/uL (ref 4.22–5.81)
RDW: 14.2 % (ref 11.5–15.5)
WBC: 18.5 10*3/uL — ABNORMAL HIGH (ref 4.0–10.5)
nRBC: 0 % (ref 0.0–0.2)

## 2020-02-23 LAB — LIPASE, BLOOD: Lipase: 73 U/L — ABNORMAL HIGH (ref 11–51)

## 2020-02-23 MED ORDER — OXYCODONE-ACETAMINOPHEN 5-325 MG PO TABS
1.0000 | ORAL_TABLET | Freq: Once | ORAL | Status: AC
  Administered 2020-02-23: 1 via ORAL
  Filled 2020-02-23: qty 1

## 2020-02-23 MED ORDER — ONDANSETRON HCL 4 MG/2ML IJ SOLN
4.0000 mg | Freq: Once | INTRAMUSCULAR | Status: AC
  Administered 2020-02-23: 4 mg via INTRAVENOUS
  Filled 2020-02-23: qty 2

## 2020-02-23 MED ORDER — KETOROLAC TROMETHAMINE 30 MG/ML IJ SOLN: 30.0000 mg | Freq: Once | INTRAMUSCULAR | Status: DC

## 2020-02-23 MED ORDER — ONDANSETRON 4 MG PO TBDP: 4.0000 mg | ORAL_TABLET | Freq: Three times a day (TID) | ORAL | 0 refills | Status: AC | PRN

## 2020-02-23 MED ORDER — MORPHINE SULFATE (PF) 2 MG/ML IV SOLN
2.0000 mg | Freq: Once | INTRAVENOUS | Status: AC
  Administered 2020-02-23: 2 mg via INTRAVENOUS
  Filled 2020-02-23: qty 1

## 2020-02-23 MED ORDER — HYDROCODONE-ACETAMINOPHEN 5-325 MG PO TABS: 1.0000 | ORAL_TABLET | ORAL | 0 refills | Status: AC | PRN

## 2020-02-23 MED ORDER — IOHEXOL 300 MG/ML  SOLN
100.0000 mL | Freq: Once | INTRAMUSCULAR | Status: AC | PRN
  Administered 2020-02-23: 100 mL via INTRAVENOUS

## 2020-02-23 NOTE — ED Triage Notes (Signed)
Pt arrives to ED via POV from home with c/o RLQ abdominal pain since 930pm last night. Pt denies radiation of pain; (+) N/V/D. Pt reports 1 episode of emesis and 1 episode of diarrhea over the last 24 hrs. Pt denies CP and SHOB; no previous abdominal surgeries. Pt is A&O, in NAD; RR even, regular, and unlabored.

## 2020-02-23 NOTE — ED Provider Notes (Signed)
[facility] Emergency Department Provider Note  ____________________________________________   First MD Initiated Contact with Patient 02/23/20 0530     (approximate)  I have reviewed the triage vital signs and the nursing notes.   HISTORY  Chief Complaint Abdominal Pain    HPI Carlos Mcclain is a 64 y.o. male below list of previous medical conditions including kidney stones presents to the emergency department secondary to acute onset of right lower quadrant abdominal pain since 9:30 PM last night.  Patient admits to nausea vomiting.  Patient denies any dysuria no hematuria.  Patient denies any fever afebrile on presentation.  Patient denies any diarrhea or constipation.  Current pain score 9 out of 10.        Past Medical History:  Diagnosis Date  . Arthritis   . Cancer (HCC)    BASAL CELL  . History of kidney stones    H/O  . Hypertension     There are no problems to display for this patient.   Past Surgical History:  Procedure Laterality Date  . BICEPT TENODESIS Left 04/04/2017   Procedure: BICEPS TENODESIS;  Surgeon: Corky Mull, MD;  Location: [facility] ORS;  Service: Orthopedics;  Laterality: Left;  . SHOULDER ARTHROSCOPY WITH OPEN ROTATOR CUFF REPAIR Left 04/04/2017   Procedure: SHOULDER ARTHROSCOPY WITH OPEN ROTATOR CUFF REPAIR, decompression and debridement;  Surgeon: Corky Mull, MD;  Location: [facility] ORS;  Service: Orthopedics;  Laterality: Left;    Prior to Admission medications   Medication Sig Start Date End Date Taking? Authorizing Provider  ibuprofen (ADVIL,MOTRIN) 200 MG tablet Take 600-800 mg by mouth every 8 (eight) hours as needed for mild pain or moderate pain.    [provider]  lisinopril (PRINIVIL,ZESTRIL) 20 MG tablet Take 20 mg by mouth every morning.     [provider]  oxyCODONE (ROXICODONE) 5 MG immediate release tablet Take 1-2 tablets (5-10 mg total) by mouth every 4 (four) hours as needed for  severe pain. 04/04/17   Poggi, Marshall Cork, MD  sodium chloride (OCEAN) 0.65 % SOLN nasal spray Place 1 spray into both nostrils as needed for congestion.    [provider]    Allergies Patient has no known allergies.  No family history on file.  Social History Social History   Tobacco Use  . Smoking status: Never Smoker  . Smokeless tobacco: Never Used  Vaping Use  . Vaping Use: Never used  Substance Use Topics  . Alcohol use: No  . Drug use: No    Review of Systems Constitutional: No fever/chills Eyes: No visual changes. ENT: No sore throat. Cardiovascular: Denies chest pain. Respiratory: Denies shortness of breath. Gastrointestinal: Positive for abdominal pain.  Nausea and vomiting.  No diarrhea.  No constipation. Genitourinary: Negative for dysuria. Musculoskeletal: Negative for neck pain.  Negative for back pain. Integumentary: Negative for rash. Neurological: Negative for headaches, focal weakness or numbness.  ____________________________________________   PHYSICAL EXAM:  VITAL SIGNS: ED Triage Vitals  Enc Vitals Group     BP 02/23/20 0435 (!) 179/94     Pulse Rate 02/23/20 0435 86     Resp 02/23/20 0435 16     Temp 02/23/20 0435 98.1 F (36.7 C)     Temp Source 02/23/20 0435 Oral     SpO2 02/23/20 0435 97 %     Weight 02/23/20 0436 108.9 kg (240 lb)     Height 02/23/20 0436 1.778 m (5\' 10" )     Head Circumference --  Peak Flow --      Pain Score 02/23/20 0436 8     Pain Loc --      Pain Edu? --      Excl. in [place]? --     Constitutional: Alert and oriented.  Apparent discomfort Eyes: Conjunctivae are normal.  Mouth/Throat: Patient is wearing a mask. Neck: No stridor.  No meningeal signs.   Cardiovascular: Normal rate, regular rhythm. Good peripheral circulation. Grossly normal heart sounds. Respiratory: Normal respiratory effort.  No retractions. Gastrointestinal: Soft and nontender. No distention.  Musculoskeletal: No lower extremity  tenderness nor edema. No gross deformities of extremities. Neurologic:  Normal speech and language. No gross focal neurologic deficits are appreciated.  Skin:  Skin is warm, dry and intact. Psychiatric: Mood and affect are normal. Speech and behavior are normal.  ____________________________________________   LABS (all labs ordered are listed, but only abnormal results are displayed)  Labs Reviewed  LIPASE, BLOOD - Abnormal; Notable for the following components:      Result Value   Lipase 73 (*)    All other components within normal limits  COMPREHENSIVE METABOLIC PANEL - Abnormal; Notable for the following components:   Glucose, Bld 152 (*)    Creatinine, Ser 1.71 (*)    Total Bilirubin 1.4 (*)    GFR calc non Af Amer 42 (*)    GFR calc Af Amer 48 (*)    All other components within normal limits  CBC - Abnormal; Notable for the following components:   WBC 18.5 (*)    All other components within normal limits  URINALYSIS, COMPLETE (UACMP) WITH MICROSCOPIC - Abnormal; Notable for the following components:   Color, Urine YELLOW (*)    APPearance HAZY (*)    Hgb urine dipstick LARGE (*)    Protein, ur 100 (*)    RBC / HPF >50 (*)    All other components within normal limits   _______________________________________  RADIOLOGY I, [place] Azia Toutant, personally viewed and evaluated these images (plain radiographs) as part of my medical decision making, as well as reviewing the written report by the radiologist.  ED MD interpretation: Obstructing 4 mm right distal urethral calculus.  Cholelithiasis.  Official radiology report(s): CT ABDOMEN PELVIS W CONTRAST  Result Date: 02/23/2020 CLINICAL DATA:  Lower quadrant pain since last night. EXAM: CT ABDOMEN AND PELVIS WITH CONTRAST TECHNIQUE: Multidetector CT imaging of the abdomen and pelvis was performed using the standard protocol following bolus administration of intravenous contrast. CONTRAST:  163mL OMNIPAQUE IOHEXOL 300 MG/ML   SOLN COMPARISON:  None. FINDINGS: Lower chest:  No contributory findings. Hepatobiliary: No focal liver abnormality.Cholelithiasis without evidence of biliary obstruction or inflammation. Pancreas: Unremarkable. Spleen: Unremarkable. Adrenals/Urinary Tract: 2.5 cm right adrenal nodule. Mild right hydroureteronephrosis with perinephric and Peri renal stranding. Delayed renal excretion on the right. Obstructive findings related to a 4 mm stone in the distal right ureter. There are 2 additional right renal calculi measuring up to 11 mm at the lower pole. No left hydronephrosis or urolithiasis. Unremarkable bladder. Stomach/Bowel: No obstruction. No appendicitis. Colonic diverticulosis. Vascular/Lymphatic: No acute vascular abnormality. Diffuse atherosclerotic calcification. No mass or adenopathy. Reproductive:Coarse calcification in the prostate. Other: No ascites or pneumoperitoneum. Musculoskeletal: No acute finding. 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 Is also L4-5 disc degeneration and foraminal narrowings. IMPRESSION: 1. Obstructing 4 mm distal right ureteral calculus. 2. Right nephrolithiasis. 3. 2.5 cm right adrenal nodule most likely reflecting adenoma in the absence  of malignancy history. Consider adrenal CT for confirmation. 4. Cholelithiasis. Electronically Signed   By: Monte Fantasia M.D.   On: 02/23/2020 06:07     Procedures   ____________________________________________   INITIAL IMPRESSION / MDM / [place] / ED COURSE  As part of my medical decision making, I reviewed the following data within the electronic MEDICAL RECORD NUMBER  64 year old male presented with above-stated history and physical exam a differential diagnosis including but not limited to ureterolithiasis, appendicitis, diverticulitis pancreatitis.  Patient given 2 mg of IV morphine and 4 mg of IV Zofran with improvement of pain current pain  score 4 out of 10.  CT scan of the abdomen pelvis was performed which revealed evidence of a 4 mm distal ureteral calculus.  Urinalysis revealed no evidence of urinary tract infection patient with a leukocytosis with white blood cell count of 18.5.  Patient informed of ureterolithiasis and need to follow-up with Dr. Erlene Quan urologist. ____________________________________________  FINAL CLINICAL IMPRESSION(S) / ED DIAGNOSES  Final diagnoses:  Kidney stone on right side     MEDICATIONS GIVEN DURING THIS VISIT:  Medications  oxyCODONE-acetaminophen (PERCOCET/ROXICET) 5-325 MG per tablet 1 tablet (has no administration in time range)  morphine 2 MG/ML injection 2 mg (2 mg Intravenous Given 02/23/20 0537)  ondansetron (ZOFRAN) injection 4 mg (4 mg Intravenous Given 02/23/20 0535)  iohexol (OMNIPAQUE) 300 MG/ML solution 100 mL (100 mLs Intravenous Contrast Given 02/23/20 0550)     ED Discharge Orders    None      *Please note:  Mariel Gaudin was evaluated in Emergency Department on 02/23/2020 for the symptoms described in the history of present illness. He was evaluated in the context of the global COVID-19 pandemic, which necessitated consideration that the patient might be at risk for infection with the SARS-CoV-2 virus that causes COVID-19. Institutional protocols and algorithms that pertain to the evaluation of patients at risk for COVID-19 are in a state of rapid change based on information released by regulatory bodies including the CDC and federal and state organizations. These policies and algorithms were followed during the patient's care in the ED.  Some ED evaluations and interventions may be delayed as a result of limited staffing during the pandemic.*  Note:  This document was prepared using Dragon voice recognition software and may include unintentional dictation errors.   Gregor Hams, MD 02/23/20 [phone]

## 2020-03-10 HISTORY — PX: CATARACT EXTRACTION W/ INTRAOCULAR LENS  IMPLANT, BILATERAL: SHX1307

## 2020-04-29 ENCOUNTER — Other Ambulatory Visit: Payer: Self-pay | Admitting: Urology

## 2020-05-18 ENCOUNTER — Encounter (HOSPITAL_BASED_OUTPATIENT_CLINIC_OR_DEPARTMENT_OTHER): Payer: Self-pay | Admitting: Urology

## 2020-05-19 ENCOUNTER — Encounter (HOSPITAL_BASED_OUTPATIENT_CLINIC_OR_DEPARTMENT_OTHER): Payer: Self-pay | Admitting: Urology

## 2020-05-20 ENCOUNTER — Encounter (HOSPITAL_BASED_OUTPATIENT_CLINIC_OR_DEPARTMENT_OTHER): Payer: Self-pay | Admitting: Urology

## 2020-05-20 ENCOUNTER — Other Ambulatory Visit: Payer: Self-pay

## 2020-05-20 NOTE — Progress Notes (Signed)
Spoke w/ via phone for pre-op interview--- PT Lab needs dos----  Istat             Lab results------ no COVID test ------ 05-20-2020 @ 1005 Arrive at ------- 0900 NPO after MN NO Solid Food.  Clear liquids from MN until--- 0800 Medications to take morning of surgery ----- NONE Diabetic medication ----- n/a Patient Special Instructions ----- n/a Pre-Op special Istructions ----- n/a Patient verbalized understanding of instructions that were given at this phone interview. Patient denies shortness of breath, chest pain, fever, cough at this phone interview.

## 2020-05-21 ENCOUNTER — Other Ambulatory Visit (HOSPITAL_COMMUNITY)
Admission: RE | Admit: 2020-05-21 | Discharge: 2020-05-21 | Disposition: A | Discharge status: Home / Self Care | Payer: Managed Care, Other (non HMO) | Source: Ambulatory Visit | Attending: Urology | Admitting: Urology

## 2020-05-21 ENCOUNTER — Other Ambulatory Visit (HOSPITAL_COMMUNITY): Payer: Managed Care, Other (non HMO)

## 2020-05-21 DIAGNOSIS — Z01818 Encounter for other preprocedural examination: Secondary | ICD-10-CM | POA: Insufficient documentation

## 2020-05-21 DIAGNOSIS — Z20822 Contact with and (suspected) exposure to covid-19: Secondary | ICD-10-CM | POA: Insufficient documentation

## 2020-05-21 LAB — SARS CORONAVIRUS 2 (TAT 6-24 HRS): SARS Coronavirus 2: NEGATIVE

## 2020-05-23 NOTE — H&P (Signed)
HPI: Carlos Mcclain is a 64 year-old male established patient who is here for F/U due to a history of renal calculi.   03/10/2020: 2 week follow-up today after medical expulsive therapy initiated in an attempt to pass a distal right ureteral calculi. KUB at last office visit as well as today shows a faint opacity along the anatomical expected tract of the distal right ureter consistent with previously diagnosed ureteral calculi on CT imaging. Patient also has a large 1 cm right renal pelvis stone.   Patient denies any interval stone material passage. He admits to being less than compliant with continue tamsulosin use. He has not had any significant recurrence of right-sided pain or discomfort only having an occasional twinge of pain in the right side of the groin and bladder area. This is not required the use of any type pain medication. Patient remains afebrile and without any interval nausea/vomiting. States grossly voiding at his baseline with non bothersome symptomatology. He specifically denies any burning or painful urination, visible blood in his urine.   The patient was last seen 02/25/2020. The patient's stone was on his right side. He did not pass a stone since the last office visit. The patient has been taking Tamsulosin for their calculus disease.   The patient has not had any flank pain since they were last seen. The patient denies any progressive voiding symptoms. He has no seen blood in his urine since the last visit. He is not currently having flank pain, back pain, groin pain, nausea, vomiting, fever or chills.    -04/15/20-patient with history of 3- 4 mm right distal ureteral calculus and a larger 1 cm calculus that may have migrated out of the kidney into the UPJ/upper ureteral area on KUB. The patient was minimally symptomatic and placed on medical expulsive therapy with tamsulosin. In the interim he has had minimal discomfort. Denies any nausea vomiting fever or gross hematuria.Marland Kitchen Here  for follow-up KUB.  KUB is reviewed today and shows a faint calcification in the region of the right distal ureter similar to prior KUB suggestive of retained 4 mm distal stone. Also the 1 cm by 5 mm calcification in the region of the right UPJ suggested of migrated renal calculus to the right UPJ/right upper ureter.     ALLERGIES: None   MEDICATIONS: Lisinopril  Colchicine     GU PSH: None   NON-GU PSH: Cataract surgery, Right Rotator cuff surgery, Left     GU PMH: Ureteral calculus - 02/25/2020 Renal calculus    NON-GU PMH: Arthritis Gout Hypercholesterolemia Hypertension    FAMILY HISTORY: None   SOCIAL HISTORY: Marital Status: Married Preferred Language: English; Ethnicity: Not Hispanic Or Latino; Race: White Current Smoking Status: Patient has never smoked.   Tobacco Use Assessment Completed: Used Tobacco in last 30 days? Does not use smokeless tobacco. Has never drank.  Does not use drugs. Drinks 3 caffeinated drinks per day. Has not had a blood transfusion.    REVIEW OF SYSTEMS:    GU Review Male:   Patient denies frequent urination, hard to postpone urination, burning/ pain with urination, get up at night to urinate, leakage of urine, stream starts and stops, trouble starting your stream, have to strain to urinate , erection problems, and penile pain.  Gastrointestinal (Upper):   Patient denies nausea, vomiting, and indigestion/ heartburn.  Gastrointestinal (Lower):   Patient denies diarrhea and constipation.  Constitutional:   Patient denies fever, night sweats, weight loss, and fatigue.  Skin:  Patient denies skin rash/ lesion and itching.  Eyes:   Patient denies blurred vision and double vision.  Ears/ Nose/ Throat:   Patient denies sore throat and sinus problems.  Hematologic/Lymphatic:   Patient denies swollen glands and easy bruising.  Cardiovascular:   Patient denies leg swelling and chest pains.  Respiratory:   Patient denies cough and shortness of  breath.  Endocrine:   Patient denies excessive thirst.  Musculoskeletal:   Patient denies back pain and joint pain.  Neurological:   Patient denies headaches and dizziness.  Psychologic:   Patient denies depression and anxiety.   VITAL SIGNS:      04/15/2020 01:25 PM  Weight 230 lb / 104.33 kg  Height 70 in / 177.8 cm  BP 119/78 mmHg  Heart Rate 121 /min  Temperature 98.4 F / 36.8 C  BMI 33.0 kg/m   MULTI-SYSTEM PHYSICAL EXAMINATION:    Constitutional: Well-nourished. No physical deformities. Normally developed. Good grooming.  Neck: Neck symmetrical, not swollen. Normal tracheal position.  Respiratory: No labored breathing, no use of accessory muscles.   Cardiovascular: Normal temperature, normal extremity pulses, no swelling, no varicosities.  Lymphatic: No enlargement of neck, axillae, groin.  Skin: No paleness, no jaundice, no cyanosis. No lesion, no ulcer, no rash.  Neurologic / Psychiatric: Oriented to time, oriented to place, oriented to person. No depression, no anxiety, no agitation.  Gastrointestinal: No mass, no tenderness, no rigidity, non obese abdomen.  Eyes: Normal conjunctivae. Normal eyelids.  Ears, Nose, Mouth, and Throat: Left ear no scars, no lesions, no masses. Right ear no scars, no lesions, no masses. Nose no scars, no lesions, no masses. Normal hearing. Normal lips.  Musculoskeletal: Normal gait and station of head and neck.     Complexity of Data:  Source Of History:  Patient  Records Review:   Previous Doctor Records, Previous Hospital Records, Previous Patient Records  Urine Test Review:   Urinalysis  X-Ray Review: KUB: Reviewed Films. Reviewed Report. Discussed With Patient.  C.T. Abdomen/Pelvis: Reviewed Films. Reviewed Report. Discussed With Patient.     PROCEDURES:         KUB - [id number]  A single view of the abdomen is obtained.      . Patient confirmed No Neulasta OnPro Device. KUB is reviewed today shows no obvious bony abnormalities. There is  a persistent faint calcification in the region the right distal ureter measuring about 4 mm in size consistent with probable persistent right distal ureteral calculus. Also a 1 cm x 6 mm calculus in the region of the right UPJ/right upper ureter. Unchanged from prior KUB          Urinalysis - 81003 Dipstick Dipstick Cont'd  Color: Yellow Bilirubin: Neg mg/dL  Appearance: Clear Ketones: Neg mg/dL  Specific Gravity: 1.025 Blood: Neg ery/uL  pH: <=5.0 Protein: Neg mg/dL  Glucose: Neg mg/dL Urobilinogen: 0.2 mg/dL    Nitrites: Neg    Leukocyte Esterase: Neg leu/uL    ASSESSMENT:      ICD-10 Details  1 GU:   Ureteral calculus - O75.6 Acute, Complicated Injury   PLAN:           Document Letter(s):  Created for Patient: Clinical Summary         Notes:   Discussed treatment options with the patient. I recommended we go ahead and proceed with cysto and right ureteroscopy with laser lithotripsy of the right distal stone and then attempt to get up to the proximal stone in use flexible  scope with laser lithotripsy with attempt to remove this as well. If unable to get the right proximal stone will place stent and consider ESL of that stone. Patient agreeable with this approach. Risks and benefits discussed as outlined below.  I have recommended retrograde pyelogram, ureteroscopic stone manipulation with laser lithotripsy. I have discussed in detail the risks, benefits and alternatives of ureteroscopic stone extraction to include but not limited to: Bleeding, infection, ureteral perforation with need for open repair, inability to place the stent necessitating the need for further procedures, possible percutaneous nephrostomy tube placement, discomfort from the stents, hematuria, urgency, frequency and refractory problems after the stent is removed. I discussed the stent is not a permanent stent and will require a followup for stent removal or stent exchange. The patient knows there is high risk for  ureteral stent incrustation if this is not removed or exchanged within 3 months. Patient voices understanding of the risks and benefits of the procedure and consents to the procedure.

## 2020-05-24 ENCOUNTER — Encounter (HOSPITAL_BASED_OUTPATIENT_CLINIC_OR_DEPARTMENT_OTHER): Payer: Self-pay | Admitting: Urology

## 2020-05-24 ENCOUNTER — Other Ambulatory Visit: Payer: Self-pay

## 2020-05-24 ENCOUNTER — Ambulatory Visit (HOSPITAL_BASED_OUTPATIENT_CLINIC_OR_DEPARTMENT_OTHER): Payer: Managed Care, Other (non HMO) | Admitting: Anesthesiology

## 2020-05-24 ENCOUNTER — Encounter (HOSPITAL_BASED_OUTPATIENT_CLINIC_OR_DEPARTMENT_OTHER)
Admission: RE | Disposition: A | Discharge status: Home / Self Care | Payer: Self-pay | Source: Home / Self Care | Attending: Urology

## 2020-05-24 ENCOUNTER — Ambulatory Visit (HOSPITAL_BASED_OUTPATIENT_CLINIC_OR_DEPARTMENT_OTHER)
Admission: RE | Admit: 2020-05-24 | Discharge: 2020-05-24 | Disposition: A | Discharge status: Home / Self Care | Payer: Managed Care, Other (non HMO) | Attending: Urology | Admitting: Urology

## 2020-05-24 DIAGNOSIS — K219 Gastro-esophageal reflux disease without esophagitis: Secondary | ICD-10-CM | POA: Insufficient documentation

## 2020-05-24 DIAGNOSIS — N202 Calculus of kidney with calculus of ureter: Secondary | ICD-10-CM | POA: Diagnosis present

## 2020-05-24 DIAGNOSIS — M109 Gout, unspecified: Secondary | ICD-10-CM | POA: Diagnosis not present

## 2020-05-24 DIAGNOSIS — Z87442 Personal history of urinary calculi: Secondary | ICD-10-CM | POA: Insufficient documentation

## 2020-05-24 DIAGNOSIS — N35812 Other urethral bulbous stricture, male: Secondary | ICD-10-CM | POA: Insufficient documentation

## 2020-05-24 DIAGNOSIS — Z79899 Other long term (current) drug therapy: Secondary | ICD-10-CM | POA: Insufficient documentation

## 2020-05-24 DIAGNOSIS — I10 Essential (primary) hypertension: Secondary | ICD-10-CM | POA: Insufficient documentation

## 2020-05-24 HISTORY — DX: Personal history of COVID-19: Z86.16

## 2020-05-24 HISTORY — DX: Personal history of other malignant neoplasm of skin: Z85.828

## 2020-05-24 HISTORY — PX: CYSTOSCOPY WITH RETROGRADE PYELOGRAM, URETEROSCOPY AND STENT PLACEMENT: SHX5789

## 2020-05-24 HISTORY — DX: Nocturia: R35.1

## 2020-05-24 HISTORY — DX: Calculus of ureter: N20.1

## 2020-05-24 HISTORY — DX: Gastro-esophageal reflux disease without esophagitis: K21.9

## 2020-05-24 HISTORY — DX: Personal history of other malignant neoplasm of skin: Z98.890

## 2020-05-24 HISTORY — DX: Gout, unspecified: M10.9

## 2020-05-24 LAB — POCT I-STAT, CHEM 8
BUN: 17 mg/dL (ref 8–23)
Calcium, Ion: 1.28 mmol/L (ref 1.15–1.40)
Chloride: 107 mmol/L (ref 98–111)
Creatinine, Ser: 1.5 mg/dL — ABNORMAL HIGH (ref 0.61–1.24)
Glucose, Bld: 113 mg/dL — ABNORMAL HIGH (ref 70–99)
HCT: 43 % (ref 39.0–52.0)
Hemoglobin: 14.6 g/dL (ref 13.0–17.0)
Potassium: 4.4 mmol/L (ref 3.5–5.1)
Sodium: 143 mmol/L (ref 135–145)
TCO2: 23 mmol/L (ref 22–32)

## 2020-05-24 SURGERY — CYSTOURETEROSCOPY, WITH RETROGRADE PYELOGRAM AND STENT INSERTION
Anesthesia: General | Laterality: Right

## 2020-05-24 MED ORDER — IOHEXOL 300 MG/ML  SOLN
INTRAMUSCULAR | Status: DC | PRN
  Administered 2020-05-24: 6 mL

## 2020-05-24 MED ORDER — CELECOXIB 200 MG PO CAPS
200.0000 mg | ORAL_CAPSULE | Freq: Once | ORAL | Status: AC
  Administered 2020-05-24: 200 mg via ORAL

## 2020-05-24 MED ORDER — TAMSULOSIN HCL 0.4 MG PO CAPS: 0.4000 mg | ORAL_CAPSULE | Freq: Every day | ORAL | 0 refills | Status: AC

## 2020-05-24 MED ORDER — SODIUM CHLORIDE 0.9 % IR SOLN
Status: DC | PRN
  Administered 2020-05-24: 9000 mL

## 2020-05-24 MED ORDER — ONDANSETRON HCL 4 MG/2ML IJ SOLN
INTRAMUSCULAR | Status: AC
  Filled 2020-05-24: qty 2

## 2020-05-24 MED ORDER — DEXAMETHASONE SODIUM PHOSPHATE 10 MG/ML IJ SOLN
INTRAMUSCULAR | Status: DC | PRN
  Administered 2020-05-24: 10 mg via INTRAVENOUS

## 2020-05-24 MED ORDER — FENTANYL CITRATE (PF) 100 MCG/2ML IJ SOLN
INTRAMUSCULAR | Status: AC
  Filled 2020-05-24: qty 2

## 2020-05-24 MED ORDER — CELECOXIB 200 MG PO CAPS
ORAL_CAPSULE | ORAL | Status: AC
  Filled 2020-05-24: qty 1

## 2020-05-24 MED ORDER — LACTATED RINGERS IV SOLN: INTRAVENOUS | Status: DC

## 2020-05-24 MED ORDER — CEFAZOLIN SODIUM-DEXTROSE 2-4 GM/100ML-% IV SOLN
INTRAVENOUS | Status: AC
  Filled 2020-05-24: qty 100

## 2020-05-24 MED ORDER — PROMETHAZINE HCL 25 MG/ML IJ SOLN: 6.2500 mg | INTRAMUSCULAR | Status: DC | PRN

## 2020-05-24 MED ORDER — FENTANYL CITRATE (PF) 100 MCG/2ML IJ SOLN: 25.0000 ug | INTRAMUSCULAR | Status: DC | PRN

## 2020-05-24 MED ORDER — LIDOCAINE 2% (20 MG/ML) 5 ML SYRINGE
INTRAMUSCULAR | Status: DC | PRN
  Administered 2020-05-24: 80 mg via INTRAVENOUS

## 2020-05-24 MED ORDER — OXYCODONE-ACETAMINOPHEN 5-325 MG PO TABS: 1.0000 | ORAL_TABLET | ORAL | 0 refills | Status: DC | PRN

## 2020-05-24 MED ORDER — PHENYLEPHRINE 40 MCG/ML (10ML) SYRINGE FOR IV PUSH (FOR BLOOD PRESSURE SUPPORT)
PREFILLED_SYRINGE | INTRAVENOUS | Status: AC
  Filled 2020-05-24: qty 10

## 2020-05-24 MED ORDER — MIDAZOLAM HCL 2 MG/2ML IJ SOLN
INTRAMUSCULAR | Status: AC
  Filled 2020-05-24: qty 2

## 2020-05-24 MED ORDER — ACETAMINOPHEN 500 MG PO TABS
1000.0000 mg | ORAL_TABLET | Freq: Once | ORAL | Status: AC
  Administered 2020-05-24: 1000 mg via ORAL

## 2020-05-24 MED ORDER — PROPOFOL 10 MG/ML IV BOLUS
INTRAVENOUS | Status: AC
  Filled 2020-05-24: qty 20

## 2020-05-24 MED ORDER — ACETAMINOPHEN 500 MG PO TABS
ORAL_TABLET | ORAL | Status: AC
  Filled 2020-05-24: qty 2

## 2020-05-24 MED ORDER — FENTANYL CITRATE (PF) 100 MCG/2ML IJ SOLN
INTRAMUSCULAR | Status: DC | PRN
Start: 2020-05-24 — End: 2020-05-24
  Administered 2020-05-24: 25 ug via INTRAVENOUS
  Administered 2020-05-24: 50 ug via INTRAVENOUS
  Administered 2020-05-24: 25 ug via INTRAVENOUS

## 2020-05-24 MED ORDER — CEFAZOLIN SODIUM-DEXTROSE 2-4 GM/100ML-% IV SOLN
2.0000 g | INTRAVENOUS | Status: AC
  Administered 2020-05-24: 2 g via INTRAVENOUS

## 2020-05-24 MED ORDER — PROPOFOL 10 MG/ML IV BOLUS
INTRAVENOUS | Status: DC | PRN
  Administered 2020-05-24: 180 mg via INTRAVENOUS

## 2020-05-24 MED ORDER — MIDAZOLAM HCL 5 MG/5ML IJ SOLN
INTRAMUSCULAR | Status: DC | PRN
  Administered 2020-05-24: 2 mg via INTRAVENOUS

## 2020-05-24 MED ORDER — PHENYLEPHRINE 40 MCG/ML (10ML) SYRINGE FOR IV PUSH (FOR BLOOD PRESSURE SUPPORT)
PREFILLED_SYRINGE | INTRAVENOUS | Status: DC | PRN
  Administered 2020-05-24 (×3): 80 ug via INTRAVENOUS

## 2020-05-24 MED ORDER — LIDOCAINE 2% (20 MG/ML) 5 ML SYRINGE
INTRAMUSCULAR | Status: AC
  Filled 2020-05-24: qty 5

## 2020-05-24 MED ORDER — DEXAMETHASONE SODIUM PHOSPHATE 10 MG/ML IJ SOLN
INTRAMUSCULAR | Status: AC
  Filled 2020-05-24: qty 1

## 2020-05-24 MED ORDER — ONDANSETRON HCL 4 MG/2ML IJ SOLN
INTRAMUSCULAR | Status: DC | PRN
  Administered 2020-05-24: 4 mg via INTRAVENOUS

## 2020-05-24 SURGICAL SUPPLY — 27 items
BAG DRAIN URO-CYSTO SKYTR STRL (DRAIN) ×2 IMPLANT
BAG DRN UROCATH (DRAIN) ×1
BASKET STONE 1.7 NGAGE (UROLOGICAL SUPPLIES) ×2 IMPLANT
BULB IRRIG PATHFIND (MISCELLANEOUS) IMPLANT
CATH INTERMIT  6FR 70CM (CATHETERS) ×2 IMPLANT
CATH URET 5FR 28IN OPEN ENDED (CATHETERS) ×2 IMPLANT
CLOTH BEACON ORANGE TIMEOUT ST (SAFETY) ×2 IMPLANT
EXTRACTOR STONE 1.7FRX115CM (UROLOGICAL SUPPLIES) IMPLANT
FIBER LASER FLEXIVA 365 (UROLOGICAL SUPPLIES) IMPLANT
FIBER LASER TRAC TIP (UROLOGICAL SUPPLIES) ×2 IMPLANT
GLOVE BIO SURGEON STRL SZ8 (GLOVE) ×2 IMPLANT
GOWN STRL REUS W/TWL XL LVL3 (GOWN DISPOSABLE) ×2 IMPLANT
GUIDEWIRE STR DUAL SENSOR (WIRE) ×2 IMPLANT
GUIDEWIRE ZIPWRE .038 STRAIGHT (WIRE) IMPLANT
IV NS 1000ML (IV SOLUTION) ×2
IV NS 1000ML BAXH (IV SOLUTION) ×1 IMPLANT
IV NS IRRIG 3000ML ARTHROMATIC (IV SOLUTION) ×6 IMPLANT
KIT TURNOVER CYSTO (KITS) ×2 IMPLANT
MANIFOLD NEPTUNE II (INSTRUMENTS) ×2 IMPLANT
NS IRRIG 500ML POUR BTL (IV SOLUTION) ×2 IMPLANT
PACK CYSTO (CUSTOM PROCEDURE TRAY) ×2 IMPLANT
SHEATH URETERAL 12FRX35CM (MISCELLANEOUS) ×2 IMPLANT
STENT URET 6FRX26 CONTOUR (STENTS) ×2 IMPLANT
SYR 10ML LL (SYRINGE) ×2 IMPLANT
SYR 20ML LL LF (SYRINGE) ×4 IMPLANT
TUBE CONNECTING 12X1/4 (SUCTIONS) ×2 IMPLANT
TUBING UROLOGY SET (TUBING) ×2 IMPLANT

## 2020-05-24 NOTE — Transfer of Care (Signed)
Immediate Anesthesia Transfer of Care Note  Patient: Carlos Mcclain  Procedure(s) Performed: Procedure(s) (LRB): CYSTOSCOPY WITH RETROGRADE PYELOGRAM, URETEROSCOPY AND STENT PLACEMENT, RIGHT LASER LITHOTRIPSY (Right)  Patient Location: PACU  Anesthesia Type: General  Level of Consciousness: awake, sedated, patient cooperative and responds to stimulation  Airway & Oxygen Therapy: Patient Spontanous Breathing and Patient connected to [place] 02 and soft FM   Post-op Assessment: Report given to PACU RN, Post -op Vital signs reviewed and stable and Patient moving all extremities  Post vital signs: Reviewed and stable  Complications: No apparent anesthesia complications

## 2020-05-24 NOTE — Anesthesia Postprocedure Evaluation (Signed)
Anesthesia Post Note  Patient: Carlos Mcclain  Procedure(s) Performed: CYSTOSCOPY WITH RETROGRADE PYELOGRAM, URETEROSCOPY AND STENT PLACEMENT, RIGHT LASER LITHOTRIPSY (Right )     Patient location during evaluation: PACU Anesthesia Type: General Level of consciousness: sedated Pain management: pain level controlled Vital Signs Assessment: post-procedure vital signs reviewed and stable Respiratory status: spontaneous breathing and respiratory function stable Cardiovascular status: stable Postop Assessment: no apparent nausea or vomiting Anesthetic complications: no   No complications documented.  Last Vitals:  Vitals:   05/24/20 1245 05/24/20 1300  BP:  (!) 142/88  Pulse: 77 79  Resp: 17   Temp: 36.7 C 36.8 C  SpO2: 95% 96%    Last Pain:  Vitals:   05/24/20 1300  TempSrc:   PainSc: 3                  Venda Dice DANIEL

## 2020-05-24 NOTE — Interval H&P Note (Signed)
History and Physical Interval Note:  05/24/2020 9:53 AM  Carlos Mcclain  has presented today for surgery, with the diagnosis of RIGHT URETERAL STONES.  The various methods of treatment have been discussed with the patient and family. After consideration of risks, benefits and other options for treatment, the patient has consented to  Procedure(s) with comments: CYSTOSCOPY WITH POSSIBLE RETROGRADE PYELOGRAM, URETEROSCOPY AND STENT PLACEMENT (Right) - 1 HR HOLMIUM LASER APPLICATION (Right) as a surgical intervention.  The patient's history has been reviewed, patient examined, no change in status, stable for surgery.  I have reviewed the patient's chart and labs.  Questions were answered to the patient's satisfaction.     Remi Haggard

## 2020-05-24 NOTE — Op Note (Signed)
Operative report  Preop diagnosis: Right renal calculus and right ureteral calculus Postop diagnosis: Right renal calculus, no evidence of ureteral calculus, bulbous urethral stricture Procedure: Cystoscopy, dilation of bulbous urethral stricture, right retrograde pyelogram with intraoperative interpretation, right ureteroscopy and pyeloscopy with laser lithotripsy of renal calculus, insertion right JJ stent Surgeon: Milford Cage Anesthesia: General Estimated blood loss: Minimal Operative findings.  Bulbous urethral stricture dilated over wire with passage of the scope.  No evidence of right ureteral calculus on retrograde or ureteroscopy.  1 cm x 6 mm right UPJ stone, manipulated back to the kidney and dusted into small passable sized pieces/sand.  6 [place] by 26 cm JJ stent placed  Operative note: After obtaining for consent for the patient was taken the major cystoscopy suite placed under general anesthesia.  Placed in the dorsolithotomy position genitalia prepped and draped in usual sterile fashion.  85 Fransico was advanced into the pendulous urethra.  At the bulbous urethra there was a fairly tight stricture noted.  Could not pass the scope.  A sensor wire was subsequently passed through this area and under fluoroscopy coiled in the bladder.  Open tip catheter passed over the wire and contrast confirmed the tip was in the bladder.  Wire was replaced and the catheter removed.  Over the guidewire the scope was able to be manipulated forward and dilated short concentric stricture into the bladder without difficulty.  Right ureteral orifice was identified.  Utilizing a 5 French open tip catheter retrograde pyelogram showed no evidence of distal filling defect.  There was a proximal filling defect at the ureteropelvic junction consistent with a 1 cm stone seen on prior KUB.  Sensor guidewire was passed up to the renal pelvis and coiled.  The 6.4 French semirigid ureteroscope was advanced over the guidewire  inside the right ureteral orifice.  This was advanced along its length and no stone was encountered up to the ureteropelvic junction.  Scope was removed.  A 14 French access sheath was then passed over the guidewire first utilizing the obturator for dilation of the ureter followed by the obturator and access sheath which was passed up its length without difficulty.  The digital flexible scope was then passed through the access sheath and passed to the ureteropelvic junction where the stone was encountered.  The tip of the scope manipulated the stone back into the lower pole calyx.  Utilizing the holmium laser fiber 200 m at a rate of 6 and power 1.2 the stone was fragmented into numerous dust particles and small pieces.  The stone was very hard and took about 30 minutes to fragment and dust out.  I extracted a couple small pieces but the remaining pieces were so small they could not be entrapped in the engage basket.  Retrograde pyelogram through the flexible scope confirmed good integrity of the collecting system without extravasation of contrast.  Guidewire was passed through the flexible scope and the scope was then removed along with the access sheath visualizing the ureter along its length with no evidence of stone fragments noted.  The guidewire was left in place.  The wire was then back fed through the cystoscope and a 6 [place] by 26 cm JJ stent was placed leaving a proximal coil in the renal pelvis and a distal coil in the bladder.  There was good flow of urine through and around the stent noted.  The scope was removed bulbous urethral area was widely patent.  Bladder had been emptied and then the procedure  was terminated.  He was awakened from anesthesia and taken back to the recovery room in stable condition.  No immediate complication from the procedure.

## 2020-05-24 NOTE — Anesthesia Procedure Notes (Signed)
Procedure Name: LMA Insertion Date/Time: 05/24/2020 10:31 AM Performed by: Tylerjames Hoglund D, CRNA Pre-anesthesia Checklist: Patient identified, Emergency Drugs available, Suction available and Patient being monitored Patient Re-evaluated:Patient Re-evaluated prior to induction Oxygen Delivery Method: Circle system utilized Preoxygenation: Pre-oxygenation with 100% oxygen Induction Type: IV induction Ventilation: Mask ventilation without difficulty LMA: LMA inserted LMA Size: 4.0 Tube type: Oral Number of attempts: 1 Placement Confirmation: positive ETCO2 and breath sounds checked- equal and bilateral Tube secured with: Tape Dental Injury: Teeth and Oropharynx as per pre-operative assessment

## 2020-05-24 NOTE — Anesthesia Preprocedure Evaluation (Addendum)
Anesthesia Evaluation  Patient identified by MRN, date of birth, ID band Patient awake    Reviewed: Allergy & Precautions, NPO status , Patient's Chart, lab work & pertinent test results  History of Anesthesia Complications Negative for: history of anesthetic complications  Airway Mallampati: III  TM Distance: >3 FB Neck ROM: Full    Dental no notable dental hx. (+) Dental Advisory Given   Pulmonary neg pulmonary ROS,    Pulmonary exam normal        Cardiovascular hypertension, Pt. on medications Normal cardiovascular exam     Neuro/Psych negative neurological ROS     GI/Hepatic Neg liver ROS, GERD  Medicated,  Endo/Other  negative endocrine ROS  Renal/GU      Musculoskeletal negative musculoskeletal ROS (+)   Abdominal   Peds  Hematology negative hematology ROS (+)   Anesthesia Other Findings   Reproductive/Obstetrics                            Anesthesia Physical Anesthesia Plan  ASA: II  Anesthesia Plan: General   Post-op Pain Management:    Induction: Intravenous  PONV Risk Score and Plan: 3 and Ondansetron, Dexamethasone and Treatment may vary due to age or medical condition  Airway Management Planned: LMA  Additional Equipment:   Intra-op Plan:   Post-operative Plan: Extubation in OR  Informed Consent: I have reviewed the patients History and Physical, chart, labs and discussed the procedure including the risks, benefits and alternatives for the proposed anesthesia with the patient or authorized representative who has indicated his/her understanding and acceptance.     Dental advisory given  Plan Discussed with: Anesthesiologist and CRNA  Anesthesia Plan Comments:        Anesthesia Quick Evaluation

## 2020-05-24 NOTE — Discharge Instructions (Signed)
[facility] °[phone] °Post Ureteroscopy With or Without Stent Instructions ° °Definitions: ° °Ureter: The duct that transports urine from the kidney to the bladder. °Stent:   A plastic hollow tube that is placed into the ureter, from the kidney to the bladder to prevent the ureter from swelling shut. ° °GENERAL INSTRUCTIONS: ° °Despite the fact that no skin incisions were used, the area around the ureter and bladder is raw and irritated. The stent is a foreign body which will further irritate the bladder wall. This irritation is manifested by increased frequency of urination, both day and night, and by an increase in the urge to urinate. In some, the urge to urinate is present almost always. Sometimes the urge is strong enough that you may not be able to stop yourself from urinating. The only real cure is to remove the stent and then give time for the bladder wall to heal which can't be done until the danger of the ureter swelling shut has passed, which varies. ° °You may see some blood in your urine while the stent is in place and a few days afterwards. Do not be alarmed, even if the urine was clear for a while. Get off your feet and drink lots of fluids until clearing occurs. If you start to pass clots or don't improve, call us. ° °DIET: °You may return to your normal diet immediately. Because of the raw surface of your bladder, alcohol, spicy foods, acid type foods and drinks with caffeine may cause irritation or frequency and should be used in moderation. To keep your urine flowing freely and to avoid constipation, drink plenty of fluids during the day ( 8-10 glasses ). °Tip: Avoid cranberry juice because it is very acidic. ° °ACTIVITY: °Your physical activity doesn't need to be restricted. However, if you are very active, you may see some blood in your urine. We suggest that you reduce your activity under these circumstances until the bleeding has stopped. ° °BOWELS: °It is important to  keep your bowels regular during the postoperative period. Straining with bowel movements can cause bleeding. A bowel movement every other day is reasonable. Use a mild laxative if needed, such as Milk of Magnesia 2-3 tablespoons, or 2 Dulcolax tablets. Call if you continue to have problems. If you have been taking narcotics for pain, before, during or after your surgery, you may be constipated. Take a laxative if necessary. ° ° °MEDICATION: °You should resume your pre-surgery medications unless told not to. In addition you will often be given an antibiotic to prevent infection. These should be taken as prescribed until the bottles are finished unless you are having an unusual reaction to one of the drugs. ° °PROBLEMS YOU SHOULD REPORT TO US: °Fevers over 100.5 Fahrenheit. °Heavy bleeding, or clots ( See above notes about blood in urine ). °Inability to urinate. °Drug reactions ( hives, rash, nausea, vomiting, diarrhea ). °Severe burning or pain with urination that is not improving. ° °FOLLOW-UP: °You will need a follow-up appointment to monitor your progress. Call for this appointment at the number listed above. Usually the first appointment will be about three to fourteen days after your surgery. ° ° ° °  ° ° ° °Post Anesthesia Home Care Instructions ° °Activity: °Get plenty of rest for the remainder of the day. A responsible individual must stay with you for 24 hours following the procedure.  °For the next 24 hours, DO NOT: °-Drive a car °-Operate machinery °-Drink alcoholic beverages °-Take any medication   unless instructed by your physician °-Make any legal decisions or sign important papers. ° °Meals: °Start with liquid foods such as gelatin or soup. Progress to regular foods as tolerated. Avoid greasy, spicy, heavy foods. If nausea and/or vomiting occur, drink only clear liquids until the nausea and/or vomiting subsides. Call your physician if vomiting continues. ° °Special Instructions/Symptoms: °Your throat  may feel dry or sore from the anesthesia or the breathing tube placed in your throat during surgery. If this causes discomfort, gargle with warm salt water. The discomfort should disappear within 24 hours. ° °

## 2020-05-25 ENCOUNTER — Encounter (HOSPITAL_BASED_OUTPATIENT_CLINIC_OR_DEPARTMENT_OTHER): Payer: Self-pay | Admitting: Urology

## 2020-06-08 ENCOUNTER — Other Ambulatory Visit: Payer: Self-pay | Admitting: Urology

## 2020-06-13 NOTE — Progress Notes (Signed)
Spoke with pt by phone has jury duty day before surgery, may reschedule will call back

## 2020-06-16 NOTE — Progress Notes (Signed)
LM FOR [facility] PT ? CHANGING SURGERY DATE HAS NOT HAD COVID TEST FOR 10-12 21 SURGERY

## 2020-06-24 ENCOUNTER — Encounter (HOSPITAL_BASED_OUTPATIENT_CLINIC_OR_DEPARTMENT_OTHER): Payer: Self-pay | Admitting: Urology

## 2020-06-24 ENCOUNTER — Other Ambulatory Visit: Payer: Self-pay

## 2020-06-24 NOTE — Progress Notes (Signed)
Spoke w/ via phone for pre-op interview---Krystal Delduca [employer]  Lab needs dos----  istat 8 and ekg              Lab results------none  COVID test ------07/02/20 0900 Arrive at ------0800- NPO after MN NO Solid Food.  Clear liquids from MN until---0700 Medications to take morning of surgery -----none  Diabetic medication -----none  Patient Special Instructions -----none  Pre-Op special Istructions -----none  Patient verbalized understanding of instructions that were given at this phone interview. Patient denies shortness of breath, chest pain, fever, cough at this phone interview.

## 2020-07-02 ENCOUNTER — Other Ambulatory Visit (HOSPITAL_COMMUNITY)
Admission: RE | Admit: 2020-07-02 | Discharge: 2020-07-02 | Disposition: A | Discharge status: Home / Self Care | Payer: Managed Care, Other (non HMO) | Source: Ambulatory Visit | Attending: Urology | Admitting: Urology

## 2020-07-02 DIAGNOSIS — Z20822 Contact with and (suspected) exposure to covid-19: Secondary | ICD-10-CM | POA: Diagnosis not present

## 2020-07-02 DIAGNOSIS — Z01812 Encounter for preprocedural laboratory examination: Secondary | ICD-10-CM | POA: Diagnosis not present

## 2020-07-02 LAB — SARS CORONAVIRUS 2 (TAT 6-24 HRS): SARS Coronavirus 2: NEGATIVE

## 2020-07-04 NOTE — H&P (Signed)
have a history of kidney stones.  HPI: Carlos Mcclain is a 64 year-old male established patient who is here for F/U due to a history of renal calculi.   03/10/2020: 2 week follow-up today after medical expulsive therapy initiated in an attempt to pass a distal right ureteral calculi. KUB at last office visit as well as today shows a faint opacity along the anatomical expected tract of the distal right ureter consistent with previously diagnosed ureteral calculi on CT imaging. Patient also has a large 1 cm right renal pelvis stone.   Patient denies any interval stone material passage. He admits to being less than compliant with continue tamsulosin use. He has not had any significant recurrence of right-sided pain or discomfort only having an occasional twinge of pain in the right side of the groin and bladder area. This is not required the use of any type pain medication. Patient remains afebrile and without any interval nausea/vomiting. States grossly voiding at his baseline with non bothersome symptomatology. He specifically denies any burning or painful urination, visible blood in his urine.   The patient was last seen 02/25/2020. The patient's stone was on his right side. He did not pass a stone since the last office visit. The patient has been taking Tamsulosin for their calculus disease.   The patient has not had any flank pain since they were last seen. The patient denies any progressive voiding symptoms. He has no seen blood in his urine since the last visit. He is not currently having flank pain, back pain, groin pain, nausea, vomiting, fever or chills.    -04/15/20-patient with history of 3 4 mm right distal ureteral calculus and a larger 1 cm calculus that may have migrated out of the kidney into the UPJ/upper ureteral area on KUB. The patient was minimally symptomatic and placed on medical expulsive therapy with tamsulosin. In the interim he has had minimal discomfort. Denies any nausea vomiting  fever or gross hematuria.Marland Kitchen Here for follow-up KUB.  KUB is reviewed today and shows a faint calcification in the region of the right distal ureter similar to prior KUB suggestive of retained 4 mm distal stone. Also the cm by 5 mm calcification in the region of the right UPJ suggested of migrated renal calculus to the right UPJ/right upper ureter.  -06/06/20-patient with history of right ureteral calculi as above. Underwent cysto and right ureteroscopy and laser lithotripsy of right proximal ureteral calculus on 05/24/2020. Has significant amount of small fragments and dust particles after laser treatment. Has indwelling stent. Here for follow-up KUB and possible stent removal. Patient was also noted to have a bulbous urethral stricture which was dilated at the time of his procedure.  KUB is reviewed today shows no obvious bony abnormalities. Right JJ stent in good position. No obvious stone fragments seen in the area of the right upper ureter or along the JJ stent or in the kidney.  Cystoscopy: Urethra scope the was performed today and patient had a recurrent bulbous urethral stricture. This was very narrow and I could not easily pass the scope and was very uncomfortable for him so I did not try and press to dilate the area.. The patient has been voiding okay     ALLERGIES: No Allergies    MEDICATIONS: Lisinopril  Colchicine     GU PSH: Cysto Dilate Stricture (M or F) - 05/24/2020 Ureteroscopic laser litho, Right - 05/24/2020     NON-GU PSH: Cataract surgery, Right Rotator cuff surgery, Left  GU PMH: Ureteral calculus - 02/25/2020 Renal calculus    NON-GU PMH: Arthritis Gout Hypercholesterolemia Hypertension    FAMILY HISTORY: No Family History    SOCIAL HISTORY: Marital Status: Married Preferred Language: English; Ethnicity: Not Hispanic Or Latino; Race: White Current Smoking Status: Patient has never smoked.   Tobacco Use Assessment Completed: Used Tobacco in last 30  days? Does not use smokeless tobacco. Has never drank.  Does not use drugs. Drinks 3 caffeinated drinks per day. Has not had a blood transfusion.    REVIEW OF SYSTEMS:    GU Review Male:   Patient reports frequent urination, hard to postpone urination, burning/ pain with urination, and get up at night to urinate. Patient denies leakage of urine, stream starts and stops, trouble starting your stream, have to strain to urinate , erection problems, and penile pain.  Gastrointestinal (Upper):   Patient denies nausea, vomiting, and indigestion/ heartburn.  Gastrointestinal (Lower):   Patient denies diarrhea and constipation.  Constitutional:   Patient denies fever, night sweats, weight loss, and fatigue.  Skin:   Patient denies skin rash/ lesion and itching.  Eyes:   Patient denies blurred vision and double vision.  Ears/ Nose/ Throat:   Patient denies sore throat and sinus problems.  Hematologic/Lymphatic:   Patient denies swollen glands and easy bruising.  Cardiovascular:   Patient denies chest pains and leg swelling.  Respiratory:   Patient denies cough and shortness of breath.  Endocrine:   Patient denies excessive thirst.  Musculoskeletal:   Patient denies back pain and joint pain.  Neurological:   Patient denies headaches and dizziness.  Psychologic:   Patient denies depression and anxiety.   VITAL SIGNS: None   GU PHYSICAL EXAMINATION:    Anus and Perineum: No hemorrhoids. No anal stenosis. No rectal fissure, no anal fissure. No edema, no dimple, no perineal tenderness, no anal tenderness.  Scrotum: No lesions. No edema. No cysts. No warts.  Epididymides: Right: no spermatocele, no masses, no cysts, no tenderness, no induration, no enlargement. Left: no spermatocele, no masses, no cysts, no tenderness, no induration, no enlargement.  Testes: No tenderness, no swelling, no enlargement left testes. No tenderness, no swelling, no enlargement right testes. Normal location left testes.  Normal location right testes. No mass, no cyst, no varicocele, no hydrocele left testes. No mass, no cyst, no varicocele, no hydrocele right testes.  Urethral Meatus: Normal size. No lesion, no wart, no discharge, no polyp. Normal location.  Penis: Circumcised, no warts, no cracks. No dorsal Peyronie's plaques, no left corporal Peyronie's plaques, no right corporal Peyronie's plaques, no scarring, no warts. No balanitis, no meatal stenosis.  Prostate: 40 gram or 2+ size. Left lobe normal consistency, right lobe normal consistency. Symmetrical lobes. No prostate nodule. Left lobe no tenderness, right lobe no tenderness.  Seminal Vesicles: Nonpalpable.  Sphincter Tone: Normal sphincter. No rectal tenderness. No rectal mass.    MULTI-SYSTEM PHYSICAL EXAMINATION:    Constitutional: Well-nourished. No physical deformities. Normally developed. Good grooming.  Neck: Neck symmetrical, not swollen. Normal tracheal position.  Respiratory: No labored breathing, no use of accessory muscles.   Cardiovascular: Normal temperature, normal extremity pulses, no swelling, no varicosities.  Lymphatic: No enlargement of neck, axillae, groin.  Skin: No paleness, no jaundice, no cyanosis. No lesion, no ulcer, no rash.  Neurologic / Psychiatric: Oriented to time, oriented to place, oriented to person. No depression, no anxiety, no agitation.  Gastrointestinal: No mass, no tenderness, no rigidity, non obese abdomen.  Eyes: Normal conjunctivae.  Normal eyelids.  Ears, Nose, Mouth, and Throat: Left ear no scars, no lesions, no masses. Right ear no scars, no lesions, no masses. Nose no scars, no lesions, no masses. Normal hearing. Normal lips.  Musculoskeletal: Normal gait and station of head and neck.     PAST DATA REVIEW: None   PROCEDURES:         KUB - 74018  A single view of the abdomen is obtained.      Patient confirmed No Neulasta OnPro Device. KUB is reviewed today shows no obvious bony abnormalities. Right  JJ stent is in good position no obvious stones overlying the right renal shadow or along the right JJ stent.     ASSESSMENT:      ICD-10 Details  1 GU:   Renal and ureteral calculus - N20.2 Acute, Resolved  2   Bulbar urethral stricture - N35.011 Undiagnosed New Problem   PLAN:           Orders X-Rays: KUB          Schedule         Document Letter(s):  Created for Patient: Clinical Summary         Notes:   I discussed the findings in the urethral area. We will schedule for cysto dilation of bulbous urethral stricture with right JJ stent removal under anesthesia. I told him we will likely we would place a catheter and allow this area to heal over the catheter in that this area has recurred within a week after dilating with the scope on 05/24/2020.

## 2020-07-04 NOTE — Anesthesia Preprocedure Evaluation (Addendum)
Anesthesia Evaluation  Patient identified by MRN, date of birth, ID band Patient awake    Reviewed: Allergy & Precautions, NPO status , Patient's Chart, lab work & pertinent test results  Airway Mallampati: III  TM Distance: >3 FB Neck ROM: Full    Dental no notable dental hx. (+) Teeth Intact, Implants, Dental Advisory Given   Pulmonary neg pulmonary ROS,    Pulmonary exam normal breath sounds clear to auscultation       Cardiovascular hypertension, Pt. on medications Normal cardiovascular exam Rhythm:Regular Rate:Normal  Lisinopril   07/05/20 EKG ST R 103    Neuro/Psych negative neurological ROS  negative psych ROS   GI/Hepatic Neg liver ROS, GERD  ,  Endo/Other  negative endocrine ROS  Renal/GU Renal diseaseNephrolithiasis K+ 4.2 Cr 1.4   S/P cystoscopy 05/24/20     Musculoskeletal  (+) Arthritis ,   Abdominal (+) + obese,   Peds  Hematology negative hematology ROS (+) Hgb 13.6   Anesthesia Other Findings   Reproductive/Obstetrics                            Anesthesia Physical Anesthesia Plan  ASA: II  Anesthesia Plan: General   Post-op Pain Management:    Induction: Intravenous  PONV Risk Score and Plan: 3 and Treatment may vary due to age or medical condition, Ondansetron and Midazolam  Airway Management Planned: LMA  Additional Equipment: None  Intra-op Plan:   Post-operative Plan:   Informed Consent: I have reviewed the patients History and Physical, chart, labs and discussed the procedure including the risks, benefits and alternatives for the proposed anesthesia with the patient or authorized representative who has indicated his/her understanding and acceptance.     Dental advisory given  Plan Discussed with: Anesthesiologist and CRNA  Anesthesia Plan Comments: ( LMA GA)       Anesthesia Quick Evaluation

## 2020-07-05 ENCOUNTER — Encounter (HOSPITAL_BASED_OUTPATIENT_CLINIC_OR_DEPARTMENT_OTHER)
Admission: RE | Disposition: A | Discharge status: Home / Self Care | Payer: Self-pay | Source: Home / Self Care | Attending: Urology

## 2020-07-05 ENCOUNTER — Ambulatory Visit (HOSPITAL_BASED_OUTPATIENT_CLINIC_OR_DEPARTMENT_OTHER)
Admission: RE | Admit: 2020-07-05 | Discharge: 2020-07-05 | Disposition: A | Discharge status: Home / Self Care | Payer: Managed Care, Other (non HMO) | Attending: Urology | Admitting: Urology

## 2020-07-05 ENCOUNTER — Ambulatory Visit (HOSPITAL_BASED_OUTPATIENT_CLINIC_OR_DEPARTMENT_OTHER): Payer: Managed Care, Other (non HMO) | Admitting: Anesthesiology

## 2020-07-05 ENCOUNTER — Other Ambulatory Visit: Payer: Self-pay

## 2020-07-05 ENCOUNTER — Encounter (HOSPITAL_BASED_OUTPATIENT_CLINIC_OR_DEPARTMENT_OTHER): Payer: Self-pay | Admitting: Urology

## 2020-07-05 DIAGNOSIS — N35912 Unspecified bulbous urethral stricture, male: Secondary | ICD-10-CM | POA: Diagnosis not present

## 2020-07-05 DIAGNOSIS — Z87442 Personal history of urinary calculi: Secondary | ICD-10-CM | POA: Diagnosis not present

## 2020-07-05 HISTORY — PX: CYSTOSCOPY W/ URETERAL STENT REMOVAL: SHX1430

## 2020-07-05 HISTORY — PX: CYSTOSCOPY WITH URETHRAL DILATATION: SHX5125

## 2020-07-05 LAB — POCT I-STAT, CHEM 8
BUN: 13 mg/dL (ref 8–23)
Calcium, Ion: 1.23 mmol/L (ref 1.15–1.40)
Chloride: 105 mmol/L (ref 98–111)
Creatinine, Ser: 1.4 mg/dL — ABNORMAL HIGH (ref 0.61–1.24)
Glucose, Bld: 113 mg/dL — ABNORMAL HIGH (ref 70–99)
HCT: 40 % (ref 39.0–52.0)
Hemoglobin: 13.6 g/dL (ref 13.0–17.0)
Potassium: 4.2 mmol/L (ref 3.5–5.1)
Sodium: 143 mmol/L (ref 135–145)
TCO2: 19 mmol/L — ABNORMAL LOW (ref 22–32)

## 2020-07-05 SURGERY — CYSTOSCOPY, WITH URETHRAL DILATION
Anesthesia: General | Site: Urethra | Laterality: Right

## 2020-07-05 MED ORDER — ONDANSETRON HCL 4 MG/2ML IJ SOLN: 4.0000 mg | Freq: Once | INTRAMUSCULAR | Status: DC | PRN

## 2020-07-05 MED ORDER — MIDAZOLAM HCL 2 MG/2ML IJ SOLN
INTRAMUSCULAR | Status: DC | PRN
  Administered 2020-07-05: 2 mg via INTRAVENOUS

## 2020-07-05 MED ORDER — ONDANSETRON HCL 4 MG/2ML IJ SOLN
INTRAMUSCULAR | Status: AC
  Filled 2020-07-05: qty 2

## 2020-07-05 MED ORDER — DEXAMETHASONE SODIUM PHOSPHATE 10 MG/ML IJ SOLN
INTRAMUSCULAR | Status: DC | PRN
  Administered 2020-07-05: 10 mg via INTRAVENOUS

## 2020-07-05 MED ORDER — FENTANYL CITRATE (PF) 100 MCG/2ML IJ SOLN
INTRAMUSCULAR | Status: DC | PRN
  Administered 2020-07-05 (×2): 25 ug via INTRAVENOUS
  Administered 2020-07-05: 50 ug via INTRAVENOUS

## 2020-07-05 MED ORDER — KETOROLAC TROMETHAMINE 15 MG/ML IJ SOLN
INTRAMUSCULAR | Status: DC | PRN
  Administered 2020-07-05: 15 mg via INTRAVENOUS

## 2020-07-05 MED ORDER — MIDAZOLAM HCL 2 MG/2ML IJ SOLN
INTRAMUSCULAR | Status: AC
  Filled 2020-07-05: qty 2

## 2020-07-05 MED ORDER — ARTIFICIAL TEARS OPHTHALMIC OINT
TOPICAL_OINTMENT | OPHTHALMIC | Status: AC
  Filled 2020-07-05: qty 3.5

## 2020-07-05 MED ORDER — BELLADONNA ALKALOIDS-OPIUM 16.2-60 MG RE SUPP
RECTAL | Status: AC
Start: 2020-07-05 — End: ?
  Filled 2020-07-05: qty 1

## 2020-07-05 MED ORDER — LACTATED RINGERS IV SOLN: INTRAVENOUS | Status: DC

## 2020-07-05 MED ORDER — PROPOFOL 10 MG/ML IV BOLUS
INTRAVENOUS | Status: DC | PRN
  Administered 2020-07-05: 140 mg via INTRAVENOUS

## 2020-07-05 MED ORDER — CEFAZOLIN SODIUM-DEXTROSE 2-4 GM/100ML-% IV SOLN
2.0000 g | INTRAVENOUS | Status: AC
  Administered 2020-07-05: 2 g via INTRAVENOUS

## 2020-07-05 MED ORDER — PROPOFOL 10 MG/ML IV BOLUS
INTRAVENOUS | Status: AC
  Filled 2020-07-05: qty 40

## 2020-07-05 MED ORDER — ONDANSETRON HCL 4 MG/2ML IJ SOLN
INTRAMUSCULAR | Status: DC | PRN
  Administered 2020-07-05: 4 mg via INTRAVENOUS

## 2020-07-05 MED ORDER — HYDROCODONE-ACETAMINOPHEN 7.5-325 MG PO TABS: 1.0000 | ORAL_TABLET | Freq: Once | ORAL | Status: DC | PRN

## 2020-07-05 MED ORDER — LIDOCAINE 2% (20 MG/ML) 5 ML SYRINGE
INTRAMUSCULAR | Status: DC | PRN
  Administered 2020-07-05: 100 mg via INTRAVENOUS

## 2020-07-05 MED ORDER — FENTANYL CITRATE (PF) 100 MCG/2ML IJ SOLN
INTRAMUSCULAR | Status: AC
  Filled 2020-07-05: qty 2

## 2020-07-05 MED ORDER — PHENYLEPHRINE 40 MCG/ML (10ML) SYRINGE FOR IV PUSH (FOR BLOOD PRESSURE SUPPORT)
PREFILLED_SYRINGE | INTRAVENOUS | Status: DC | PRN
  Administered 2020-07-05: 160 ug via INTRAVENOUS
  Administered 2020-07-05: 80 ug via INTRAVENOUS

## 2020-07-05 MED ORDER — DEXAMETHASONE SODIUM PHOSPHATE 10 MG/ML IJ SOLN
INTRAMUSCULAR | Status: AC
  Filled 2020-07-05: qty 1

## 2020-07-05 MED ORDER — STERILE WATER FOR IRRIGATION IR SOLN
Status: DC | PRN
  Administered 2020-07-05: 3000 mL

## 2020-07-05 MED ORDER — AMISULPRIDE (ANTIEMETIC) 5 MG/2ML IV SOLN: 10.0000 mg | Freq: Once | INTRAVENOUS | Status: DC | PRN

## 2020-07-05 MED ORDER — KETOROLAC TROMETHAMINE 30 MG/ML IJ SOLN
INTRAMUSCULAR | Status: AC
  Filled 2020-07-05: qty 1

## 2020-07-05 MED ORDER — LIDOCAINE 2% (20 MG/ML) 5 ML SYRINGE
INTRAMUSCULAR | Status: AC
  Filled 2020-07-05: qty 5

## 2020-07-05 MED ORDER — HYDROMORPHONE HCL 1 MG/ML IJ SOLN: 0.2500 mg | INTRAMUSCULAR | Status: DC | PRN

## 2020-07-05 MED ORDER — CEFAZOLIN SODIUM-DEXTROSE 2-4 GM/100ML-% IV SOLN
INTRAVENOUS | Status: AC
  Filled 2020-07-05: qty 100

## 2020-07-05 SURGICAL SUPPLY — 20 items
BAG DRAIN URO-CYSTO SKYTR STRL (DRAIN) ×3 IMPLANT
BAG DRN RND TRDRP ANRFLXCHMBR (UROLOGICAL SUPPLIES) ×2
BAG DRN UROCATH (DRAIN) ×2
BAG URINE DRAIN 2000ML AR STRL (UROLOGICAL SUPPLIES) ×3 IMPLANT
CATH FOLEY 2WAY SLVR  5CC 18FR (CATHETERS) ×1
CATH FOLEY 2WAY SLVR 5CC 18FR (CATHETERS) ×2 IMPLANT
CLOTH BEACON ORANGE TIMEOUT ST (SAFETY) ×3 IMPLANT
GLOVE BIO SURGEON STRL SZ 6.5 (GLOVE) ×3 IMPLANT
GLOVE BIO SURGEON STRL SZ7.5 (GLOVE) ×3 IMPLANT
GOWN STRL REUS W/ TWL LRG LVL3 (GOWN DISPOSABLE) ×2 IMPLANT
GOWN STRL REUS W/ TWL XL LVL3 (GOWN DISPOSABLE) ×2 IMPLANT
GOWN STRL REUS W/TWL LRG LVL3 (GOWN DISPOSABLE) ×3
GOWN STRL REUS W/TWL XL LVL3 (GOWN DISPOSABLE) ×3
GUIDEWIRE STR DUAL SENSOR (WIRE) ×3 IMPLANT
HOLDER FOLEY CATH W/STRAP (MISCELLANEOUS) ×3 IMPLANT
KIT TURNOVER CYSTO (KITS) ×3 IMPLANT
MANIFOLD NEPTUNE II (INSTRUMENTS) ×3 IMPLANT
PACK CYSTO (CUSTOM PROCEDURE TRAY) ×3 IMPLANT
TUBE CONNECTING 12X1/4 (SUCTIONS) ×3 IMPLANT
WATER STERILE IRR 3000ML UROMA (IV SOLUTION) ×3 IMPLANT

## 2020-07-05 NOTE — Anesthesia Procedure Notes (Signed)
Procedure Name: LMA Insertion Date/Time: 07/05/2020 9:47 AM Performed by: Mechele Claude, CRNA Pre-anesthesia Checklist: Patient identified, Emergency Drugs available, Suction available and Patient being monitored Patient Re-evaluated:Patient Re-evaluated prior to induction Oxygen Delivery Method: Circle system utilized Preoxygenation: Pre-oxygenation with 100% oxygen Induction Type: IV induction Ventilation: Mask ventilation without difficulty LMA: LMA inserted LMA Size: 5.0 Number of attempts: 1 Airway Equipment and Method: Bite block Placement Confirmation: positive ETCO2 Tube secured with: Tape Dental Injury: Teeth and Oropharynx as per pre-operative assessment

## 2020-07-05 NOTE — Progress Notes (Signed)
Ambulated to Phase 2 and placed in recliner in Room 2. Tolerated activity well, sipping ginger ale and eating crackers, wife notified.

## 2020-07-05 NOTE — OR Nursing (Signed)
Right ureteral stent was removed by Dr. Milford Cage at this time.

## 2020-07-05 NOTE — Anesthesia Postprocedure Evaluation (Signed)
Anesthesia Post Note  Patient: Dalon Reichart  Procedure(s) Performed: CYSTOSCOPY WITH URETHRAL DILATATION (N/A Urethra) STENT REMOVAL (Right Ureter)     Patient location during evaluation: PACU Anesthesia Type: General Level of consciousness: awake and alert Pain management: pain level controlled Vital Signs Assessment: post-procedure vital signs reviewed and stable Respiratory status: spontaneous breathing, nonlabored ventilation, respiratory function stable and patient connected to nasal cannula oxygen Cardiovascular status: blood pressure returned to baseline and stable Postop Assessment: no apparent nausea or vomiting Anesthetic complications: no   No complications documented.  Last Vitals:  Vitals:   07/05/20 1045 07/05/20 1100  BP: 107/74 105/72  Pulse: 79 83  Resp: (!) 9 19  Temp:    SpO2: 96% 96%    Last Pain:  Vitals:   07/05/20 1045  TempSrc:   PainSc: 0-No pain                 Barnet Glasgow

## 2020-07-05 NOTE — Interval H&P Note (Signed)
History and Physical Interval Note:  07/05/2020 9:32 AM  Carlos Mcclain  has presented today for surgery, with the diagnosis of BULBAR URETHRAL STRICTURE.  The various methods of treatment have been discussed with the patient and family. After consideration of risks, benefits and other options for treatment, the patient has consented to  Procedure(s) with comments: CYSTOSCOPY WITH URETHRAL DILATATION (N/A) - 30 MINS STENT REMOVAL (Right) as a surgical intervention.  The patient's history has been reviewed, patient examined, no change in status, stable for surgery.  I have reviewed the patient's chart and labs.  Questions were answered to the patient's satisfaction.     Remi Haggard

## 2020-07-05 NOTE — Discharge Instructions (Signed)
Foley catheter to gravity drain.   Post Anesthesia Home Care Instructions  Activity: Get plenty of rest for the remainder of the day. A responsible individual must stay with you for 24 hours following the procedure.  For the next 24 hours, DO NOT: -Drive a car -Paediatric nurse -Drink alcoholic beverages -Take any medication unless instructed by your physician -Make any legal decisions or sign important papers.  Meals: Start with liquid foods such as gelatin or soup. Progress to regular foods as tolerated. Avoid greasy, spicy, heavy foods. If nausea and/or vomiting occur, drink only clear liquids until the nausea and/or vomiting subsides. Call your physician if vomiting continues.  Special Instructions/Symptoms: Your throat may feel dry or sore from the anesthesia or the breathing tube placed in your throat during surgery. If this causes discomfort, gargle with warm salt water. The discomfort should disappear within 24 hours.  CYSTOSCOPY HOME CARE INSTRUCTIONS  Activity: Rest for the remainder of the day.  Do not drive or operate equipment today.  You may resume normal activities in one to two days as instructed by your physician.   Meals: Drink plenty of liquids and eat light foods such as gelatin or soup this evening.  You may return to a normal meal plan tomorrow.  Return to Work: You may return to work in one to two days or as instructed by your physician.  Special Instructions / Symptoms: Call your physician if any of these symptoms occur:   -persistent or heavy bleeding  -bleeding which continues after first few urination  -large blood clots that are difficult to pass  -urine stream diminishes or stops completely  -fever equal to or higher than 101 degrees Farenheit.  -cloudy urine with a strong, foul odor  -severe pain  Females should always wipe from front to back after elimination.  You may feel some burning pain when you urinate.  This should disappear with time.   Applying moist heat to the lower abdomen or a hot tub bath may help relieve the pain.

## 2020-07-05 NOTE — Transfer of Care (Signed)
Immediate Anesthesia Transfer of Care Note  Patient: Carlos Mcclain  Procedure(s) Performed: Procedure(s) (LRB): CYSTOSCOPY WITH URETHRAL DILATATION (N/A) STENT REMOVAL (Right)  Patient Location: PACU  Anesthesia Type: General  Level of Consciousness: awake, alert  and oriented  Airway & Oxygen Therapy: Patient Spontanous Breathing and Patient connected to face mask oxygen  Post-op Assessment: Report given to PACU RN and Post -op Vital signs reviewed and stable  Post vital signs: Reviewed and stable  Complications: No apparent anesthesia complications Last Vitals:  Vitals Value Taken Time  BP 114/82 07/05/20 1018  Temp    Pulse 89 07/05/20 1022  Resp 15 07/05/20 1022  SpO2 99 % 07/05/20 1022  Vitals shown include unvalidated device data.  Last Pain:  Vitals:   07/05/20 1018  TempSrc:   PainSc: (P) 0-No pain         Complications: No complications documented.

## 2020-07-05 NOTE — Op Note (Signed)
Operative report  Preop diagnosis: Retained right JJ stent and bulbous urethral stricture Postop diagnosis: Same Procedure: Cystoscopy with dilation of bulbous urethral stricture, removal of right JJ stent, Foley catheter insertion Surgeon: Milford Cage Anesthesia: General Estimated blood loss: Minimal  operative findings Concentric short bulbous urethral stricture, guidewire passed bladder and scope advanced through the strictured area with good dilatation and minimal trauma.  Right JJ stent removed without difficulty.  82 French Foley placed Operative note: After obtaining informed consent for the patient is taken the major cystoscopy suite placed under general anesthesia.  Placed in the dorsolithotomy position genitalia prepped and draped in usual sterile fashion.  Proper pause timeout was performed.  42 Fransico was advanced into the anterior urethra.  No strictures noted.  At the bulbous urethra there was a short concentric bulbous urethral stricture and I passed a sensor guidewire through this area into the bladder and confirmed it was in the bladder under fluoroscopy.  The 61 French scope was then advanced over the guidewire and the strictured area easily dilated with minimal trauma and I was able to advance the scope into the bladder.  The wire was removed.  Utilizing alligator graspers the right JJ stent was grasped and removed without difficulty.  The strictured area was again inspected and did not feel the further dilatation was needed.  75 French Foley catheter was subsequently placed to allow healing around the Foley.  Procedure was terminated he was awakened from anesthesia and taken back to the recovery room in stable condition.  No immediate complication from the procedure.

## 2020-07-06 ENCOUNTER — Encounter (HOSPITAL_BASED_OUTPATIENT_CLINIC_OR_DEPARTMENT_OTHER): Payer: Self-pay | Admitting: Urology

## 2020-07-29 ENCOUNTER — Ambulatory Visit (HOSPITAL_COMMUNITY)
Admission: EM | Admit: 2020-07-29 | Discharge: 2020-07-29 | Disposition: A | Discharge status: Home / Self Care | Payer: Managed Care, Other (non HMO) | Attending: Family Medicine | Admitting: Family Medicine

## 2020-07-29 ENCOUNTER — Other Ambulatory Visit: Payer: Self-pay

## 2020-07-29 ENCOUNTER — Encounter (HOSPITAL_COMMUNITY): Payer: Self-pay | Admitting: Emergency Medicine

## 2020-07-29 ENCOUNTER — Ambulatory Visit (INDEPENDENT_AMBULATORY_CARE_PROVIDER_SITE_OTHER): Payer: Managed Care, Other (non HMO)

## 2020-07-29 DIAGNOSIS — M546 Pain in thoracic spine: Secondary | ICD-10-CM

## 2020-07-29 LAB — POCT URINALYSIS DIPSTICK, ED / UC
Bilirubin Urine: NEGATIVE
Glucose, UA: NEGATIVE mg/dL
Hgb urine dipstick: NEGATIVE
Ketones, ur: NEGATIVE mg/dL
Leukocytes,Ua: NEGATIVE
Nitrite: NEGATIVE
Protein, ur: NEGATIVE mg/dL
Specific Gravity, Urine: >=1.03 (ref 1.005–1.030)
Urobilinogen, UA: 0.2 mg/dL (ref 0.0–1.0)
pH: 5 (ref 5.0–8.0)

## 2020-07-29 MED ORDER — CYCLOBENZAPRINE HCL 10 MG PO TABS: 10.0000 mg | ORAL_TABLET | Freq: Three times a day (TID) | ORAL | 0 refills | Status: AC | PRN

## 2020-07-29 NOTE — ED Triage Notes (Signed)
Patient c/o RT sided lower back pain that started last night.   Patient took Ibuprofen w/ some relief of symptoms.   Patient has difficulty with work due to back pain.   Patient denies fall or trauma.   Patient denies history of previous back problems.

## 2020-07-29 NOTE — ED Provider Notes (Signed)
[place]    CSN: [id number] Arrival date & time: 07/29/20  1830      History   Chief Complaint Chief Complaint  Patient presents with  . Back Pain    HPI Carlos Mcclain is a 64 y.o. male.   Presenting today with 5/10 right sided mid-back pain that started a few days ago. He states it is constant and dull, made worse with movement and deep breathing. Feels similar to some kidney stone issues he's had in the last few months in that area, recently had a stent placed for this. No injury or falls, fever, chills, CP, SOB, abdominal pain, N/V/D, urinary sxs. Took some ibuprofen earlier and states that took the edge off but did not take the pain away completely.      Past Medical History:  Diagnosis Date  . Arthritis   . GERD (gastroesophageal reflux disease)   . Gout followed by pcp   05-20-2020  last episode left great toe one week ago  . History of 2019 novel coronavirus disease (COVID-19)    positive result 08-17-2019 in epic, per pt mild symptoms that resolved in 3 wks  . History of basal cell carcinoma (BCC) excision   . History of kidney stones   . Hypertension    followed by pcp  (05-20-2020  per pt had stress test done greater 10 yrs ago, told it was good)  . Nocturia   . Right ureteral calculus     There are no problems to display for this patient.   Past Surgical History:  Procedure Laterality Date  . BICEPT TENODESIS Left 04/04/2017   Procedure: BICEPS TENODESIS;  Surgeon: Corky Mull, MD;  Location: [facility];  Service: Orthopedics;  Laterality: Left;  . CATARACT EXTRACTION W/ INTRAOCULAR LENS  IMPLANT, BILATERAL  03/2020  . CYSTOSCOPY W/ URETERAL STENT REMOVAL Right 07/05/2020   Procedure: STENT REMOVAL;  Surgeon: Remi Haggard, MD;  Location: [facility];  Service: Urology;  Laterality: Right;  . CYSTOSCOPY WITH RETROGRADE PYELOGRAM, URETEROSCOPY AND STENT PLACEMENT Right 05/24/2020   Procedure: CYSTOSCOPY WITH RETROGRADE  PYELOGRAM, URETEROSCOPY AND STENT PLACEMENT, RIGHT LASER LITHOTRIPSY;  Surgeon: Remi Haggard, MD;  Location: [facility];  Service: Urology;  Laterality: Right;  1 HR  . CYSTOSCOPY WITH URETHRAL DILATATION N/A 07/05/2020   Procedure: CYSTOSCOPY WITH URETHRAL DILATATION;  Surgeon: Remi Haggard, MD;  Location: [facility];  Service: Urology;  Laterality: N/A;  . SHOULDER ARTHROSCOPY WITH OPEN ROTATOR CUFF REPAIR Left 04/04/2017   Procedure: SHOULDER ARTHROSCOPY WITH OPEN ROTATOR CUFF REPAIR, decompression and debridement;  Surgeon: Corky Mull, MD;  Location: [facility];  Service: Orthopedics;  Laterality: Left;       Home Medications    Prior to Admission medications   Medication Sig Start Date End Date Taking? Authorizing Provider  ibuprofen (ADVIL,MOTRIN) 200 MG tablet Take 600-800 mg by mouth every 8 (eight) hours as needed for mild pain or moderate pain.   Yes [provider]  lisinopril (PRINIVIL,ZESTRIL) 20 MG tablet Take 20 mg by mouth every morning.    Yes [provider]  acetaminophen (TYLENOL) 500 MG tablet Take 500 mg by mouth every 6 (six) hours as needed.    [provider]  calcium carbonate (TUMS - DOSED IN MG ELEMENTAL CALCIUM) 500 MG chewable tablet Chew 1 tablet by mouth as needed for indigestion or heartburn.    [provider]  colchicine 0.6 MG tablet Take 0.6  mg by mouth 2 (two) times daily as needed.    [provider]  cyclobenzaprine (FLEXERIL) 10 MG tablet Take 1 tablet (10 mg total) by mouth 3 (three) times daily as needed for muscle spasms. DO NOT DRINK ALCOHOL OR DRIVE WHILE TAKING THIS MEDICATION 07/29/20   Volney American, PA-C  HYDROcodone-acetaminophen (NORCO/VICODIN) 5-325 MG tablet Take 1 tablet by mouth every 4 (four) hours as needed for moderate pain. 02/23/20   Gregor Hams, MD  ondansetron (ZOFRAN ODT) 4 MG disintegrating tablet Take 1 tablet (4 mg total) by mouth  every 8 (eight) hours as needed. 02/23/20   Gregor Hams, MD  sodium chloride (OCEAN) 0.65 % SOLN nasal spray Place 1 spray into both nostrils as needed for congestion.    [provider]  tamsulosin (FLOMAX) 0.4 MG CAPS capsule Take 1 capsule (0.4 mg total) by mouth daily. 05/24/20   Remi Haggard, MD    Family History History reviewed. No pertinent family history.  Social History Social History   Tobacco Use  . Smoking status: Never Smoker  . Smokeless tobacco: Never Used  Vaping Use  . Vaping Use: Never used  Substance Use Topics  . Alcohol use: No  . Drug use: No     Allergies   Patient has no known allergies.   Review of Systems Review of Systems PER HPI   Physical Exam Triage Vital Signs ED Triage Vitals  Enc Vitals Group     BP 07/29/20 1903 (!) 158/85     Pulse Rate 07/29/20 1903 (!) 121     Resp 07/29/20 1903 17     Temp 07/29/20 1903 98.6 F (37 C)     Temp Source 07/29/20 1903 Oral     SpO2 07/29/20 1903 96 %     Weight --      Height --      Head Circumference --      Peak Flow --      Pain Score 07/29/20 1859 6     Pain Loc --      Pain Edu? --      Excl. in [place]? --    No data found.  Updated Vital Signs BP (!) 158/85 (BP Location: Right Arm)   Pulse (!) 121   Temp 98.6 F (37 C) (Oral)   Resp 17   SpO2 96%   Visual Acuity Right Eye Distance:   Left Eye Distance:   Bilateral Distance:    Right Eye Near:   Left Eye Near:    Bilateral Near:     Physical Exam Vitals and nursing note reviewed.  Constitutional:      Appearance: Normal appearance.  HENT:     Head: Atraumatic.     Mouth/Throat:     Mouth: Mucous membranes are moist.     Pharynx: Oropharynx is clear.  Eyes:     Extraocular Movements: Extraocular movements intact.     Conjunctiva/sclera: Conjunctivae normal.  Cardiovascular:     Rate and Rhythm: Regular rhythm. Tachycardia present.  Pulmonary:     Effort: Pulmonary effort is normal. No respiratory  distress.     Breath sounds: Normal breath sounds. No wheezing or rales.  Abdominal:     General: Bowel sounds are normal. There is no distension.     Palpations: Abdomen is soft.     Tenderness: There is no abdominal tenderness. There is no guarding.  Musculoskeletal:        General: Tenderness (mild reproducible  right thoracic lateral back pain on palpation) present. Normal range of motion.     Cervical back: Normal range of motion and neck supple.  Skin:    General: Skin is warm and dry.  Neurological:     General: No focal deficit present.     Mental Status: He is oriented to person, place, and time.  Psychiatric:        Mood and Affect: Mood normal.        Thought Content: Thought content normal.        Judgment: Judgment normal.      UC Treatments / Results  Labs (all labs ordered are listed, but only abnormal results are displayed) Labs Reviewed  POCT URINALYSIS DIPSTICK, ED / UC    EKG   Radiology DG Chest 2 View  Result Date: 07/29/2020 CLINICAL DATA:  Right thoracic back pain.  Pleuritic chest pain. EXAM: CHEST - 2 VIEW COMPARISON:  None. FINDINGS: The heart size and mediastinal contours are within normal limits. Both lungs are clear. The visualized skeletal structures are unremarkable. IMPRESSION: No active cardiopulmonary disease. Electronically Signed   By: Dorise Bullion III M.D   On: 07/29/2020 20:58    Procedures Procedures (including critical care time)  Medications Ordered in UC Medications - No data to display  Initial Impression / Assessment and Plan / UC Course  I have reviewed the triage vital signs and the nursing notes.  Pertinent labs & imaging results that were available during my care of the patient were reviewed by me and considered in my medical decision making (see chart for details).     Patient overall very well appearing, in no distress and resting comfortably in room. He was noted to have significant tachycardia from 120-124 range  throughout time in exam room so EKG was done which showed sinus tachycardia but otherwise no significant changes from last EKG 06/2020. No ST or T wave abnormalities noted. U/A benign, exam reassuring, CXR given his pain with deep breathing without acute abnormality. CT abdomen from 02/2020 revealed no abdominal aneurysm. Discussed possibly muscle strain vs other inflammatory cause, he declines kenalog injection and wishes to try flexeril, heating pad, and NSAIDs. Strict ED precautions given for worsening sxs and recommended to f/u with PCP first thing next week for recheck.   Final Clinical Impressions(s) / UC Diagnoses   Final diagnoses:  Acute right-sided thoracic back pain   Discharge Instructions   None    ED Prescriptions    Medication Sig Dispense Auth. Provider   cyclobenzaprine (FLEXERIL) 10 MG tablet Take 1 tablet (10 mg total) by mouth 3 (three) times daily as needed for muscle spasms. DO NOT DRINK ALCOHOL OR DRIVE WHILE TAKING THIS MEDICATION 30 tablet Volney American, [place]     PDMP not reviewed this encounter.   Volney American, [place] 07/29/20 2118

## 2021-04-21 IMAGING — CT CT ABD-PELV W/ CM
2 of 5 series · 16 of 46 positions shown, 18 images · IV contrast (omnipaque)
Comparison: None.

CLINICAL DATA: Lower quadrant pain since last night.

EXAM:
CT ABDOMEN AND PELVIS WITH CONTRAST
TECHNIQUE: Multidetector CT imaging of the abdomen and pelvis was performed
using the standard protocol following bolus administration of
intravenous contrast.
CONTRAST:  100mL OMNIPAQUE IOHEXOL 300 MG/ML  SOLN

[Series 2: routine abd/pel with · axial · 0.89mm/px · z∈[-1001,-546]mm · 13 of 103 slices shown, 15 images]
[im 6/103  soft-tissue]
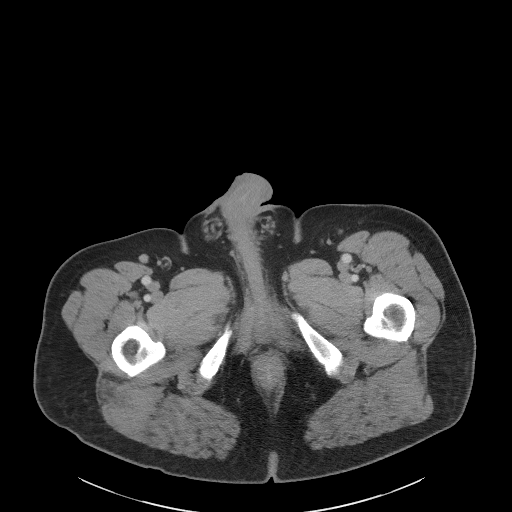
[im 6/103  bone]
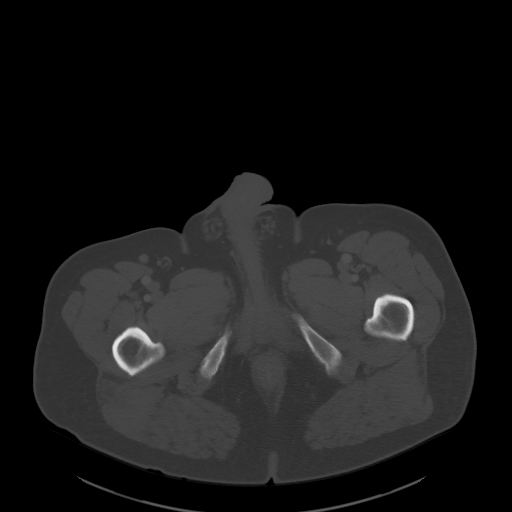
[im 17/103  soft-tissue]
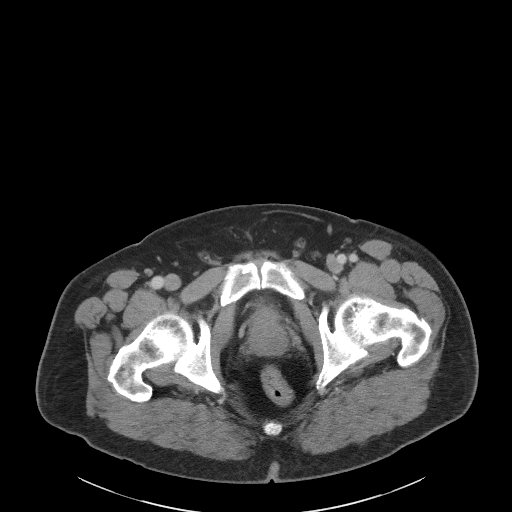
[im 22/103  soft-tissue]
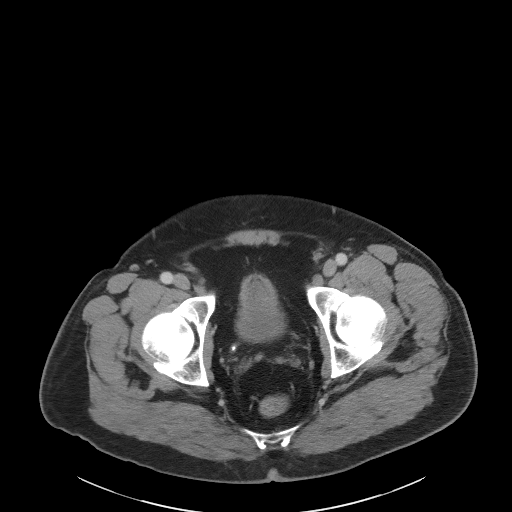
[im 27/103  soft-tissue]
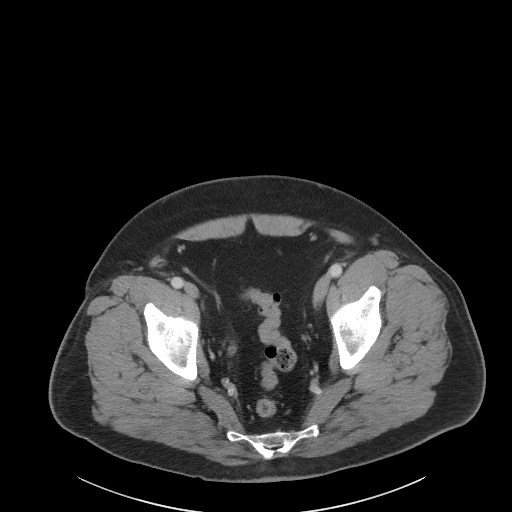
[im 38/103  soft-tissue]
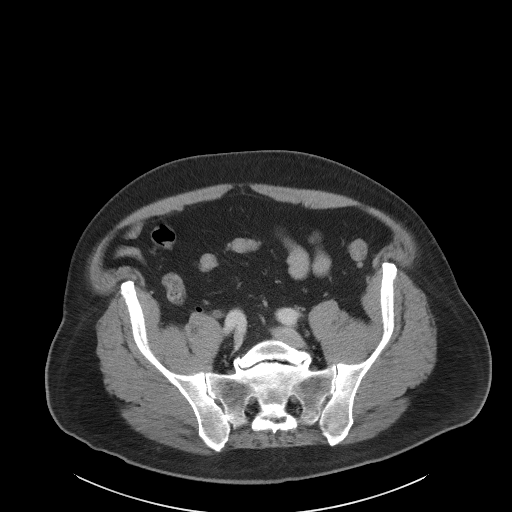
[im 43/103  soft-tissue]
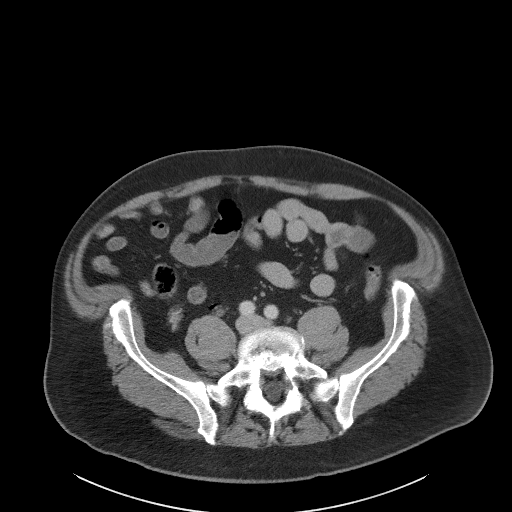
[im 54/103  soft-tissue]
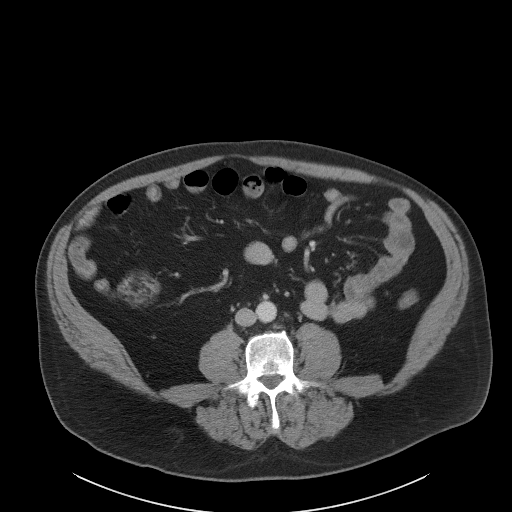
[im 60/103  soft-tissue]
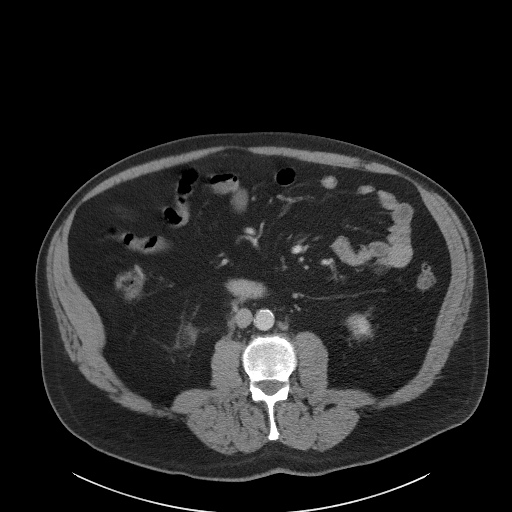
[im 65/103  soft-tissue]
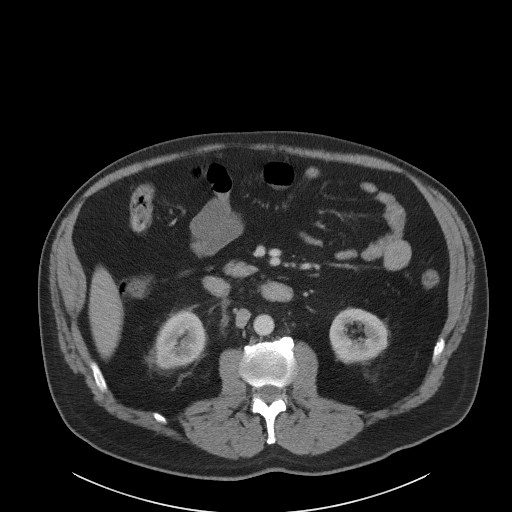
[im 65/103  bone]
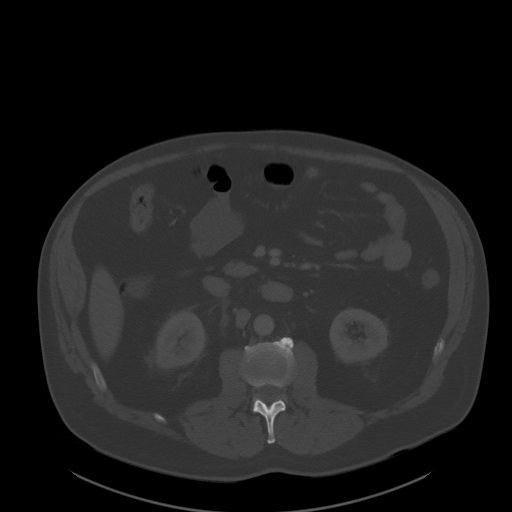
[im 76/103  soft-tissue]
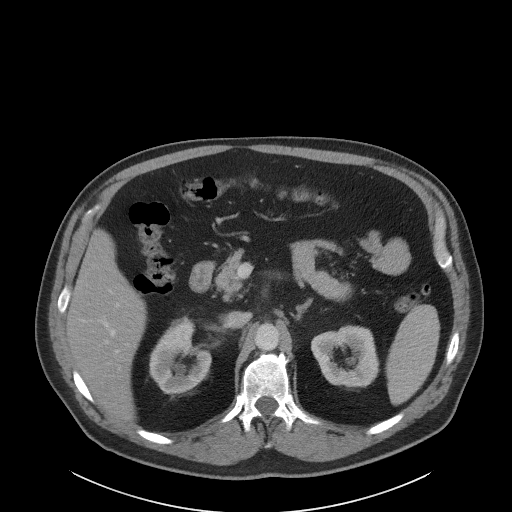
[im 81/103  soft-tissue]
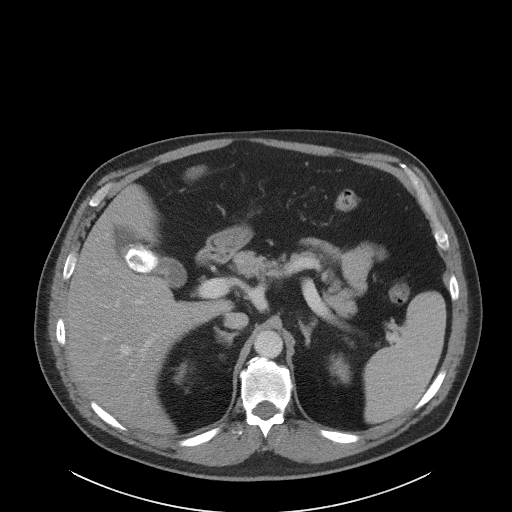
[im 86/103  soft-tissue]
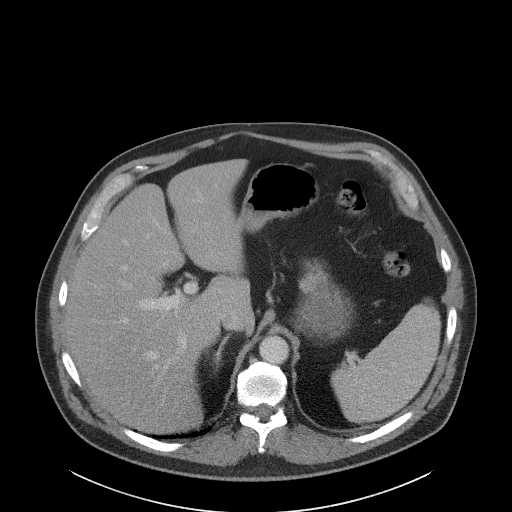
[im 97/103  soft-tissue]
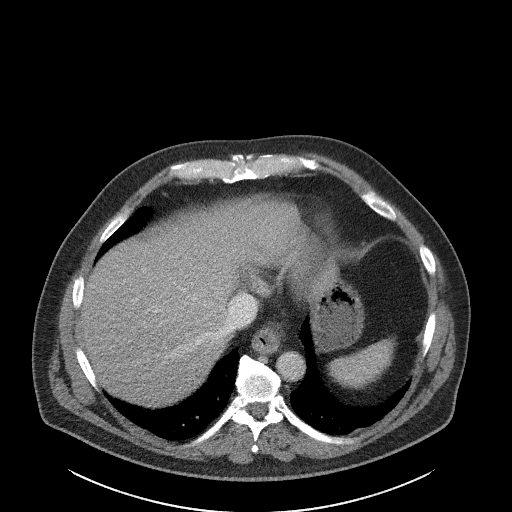

[Series 5: coronal st · coronal · 0.80mm/px · 3 of 107 slices shown]
[im 36/107  soft-tissue]
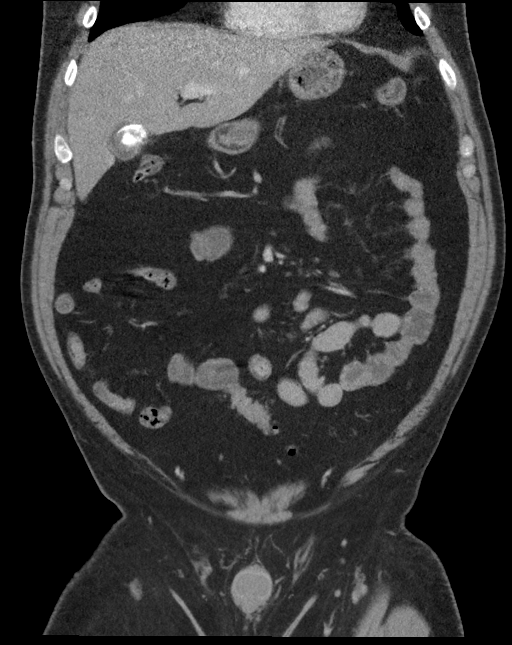
[im 48/107  soft-tissue]
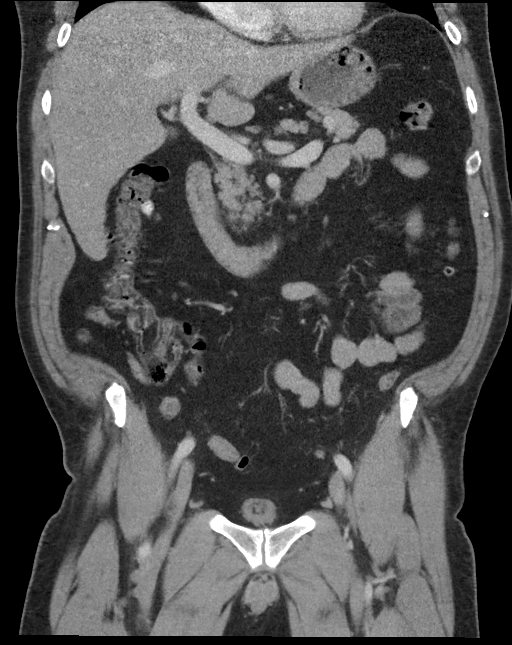
[im 59/107  soft-tissue]
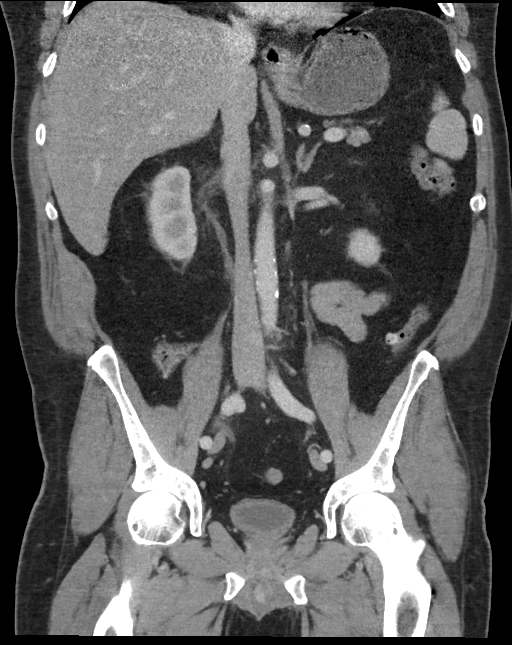

[16 of 46 positions shown; findings below may reference images not displayed]

FINDINGS: Lower chest:  No contributory findings.

Hepatobiliary: No focal liver abnormality.Cholelithiasis without
evidence of biliary obstruction or inflammation.

Pancreas: Unremarkable.

Spleen: Unremarkable.

Adrenals/Urinary Tract: 2.5 cm right adrenal nodule. Mild right
hydroureteronephrosis with perinephric and Peri renal stranding.
Delayed renal excretion on the right. Obstructive findings related
to a 4 mm stone in the distal right ureter. There are 2 additional
right renal calculi measuring up to 11 mm at the lower pole. No left
hydronephrosis or urolithiasis. Unremarkable bladder.

Stomach/Bowel: No obstruction. No appendicitis. Colonic
diverticulosis.

Vascular/Lymphatic: No acute vascular abnormality. Diffuse
atherosclerotic calcification. No mass or adenopathy.

Reproductive:Coarse calcification in the prostate.

Other: No ascites or pneumoperitoneum.

Musculoskeletal: No acute finding. L5 chronic pars defects with
L5-S1 anterolisthesis, accelerated disc degeneration, and
biforaminal L5 compression. Is also L4-5 disc degeneration and
foraminal narrowings.
IMPRESSION: 1. Obstructing 4 mm distal right ureteral calculus.
2. Right nephrolithiasis.
3. 2.5 cm right adrenal nodule most likely reflecting adenoma in the
absence of malignancy history. Consider adrenal CT for confirmation.
4. Cholelithiasis.

## 2021-09-25 IMAGING — DX DG CHEST 2V
2 series · 2 of 2 positions shown · non-contrast
Comparison: None.

CLINICAL DATA: Right thoracic back pain.  Pleuritic chest pain.

EXAM:
CHEST - 2 VIEW

[chest pa]
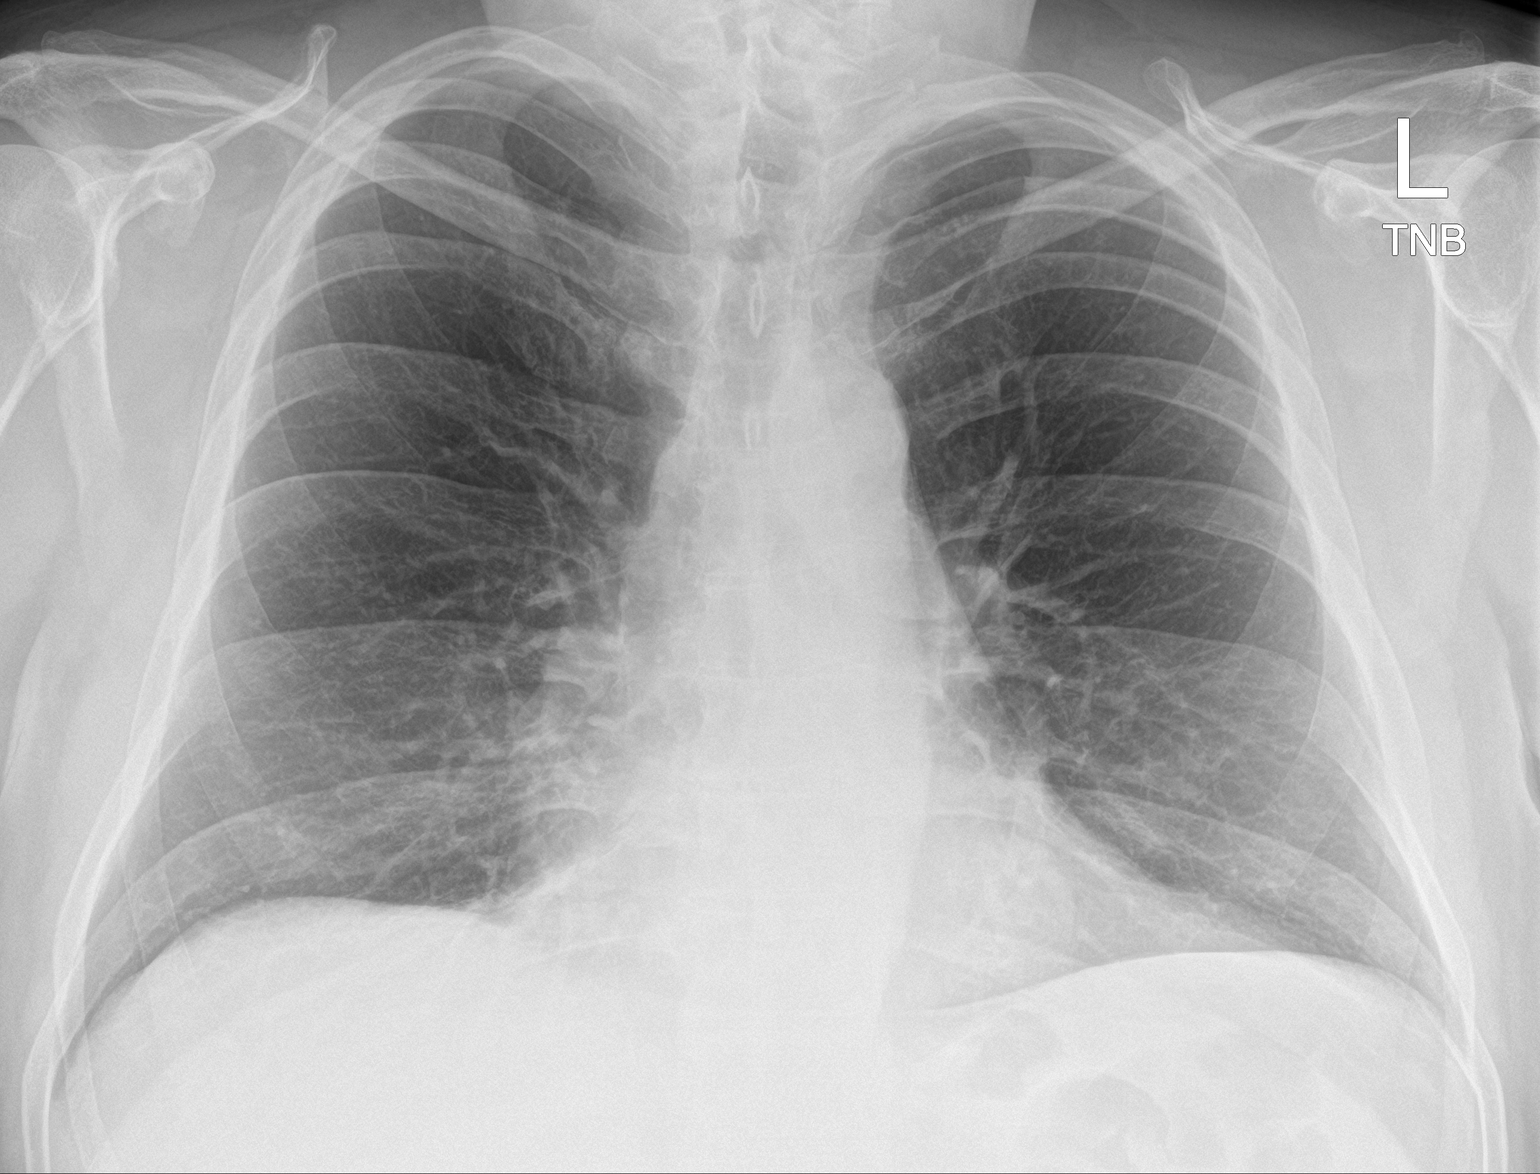

[chest lat]
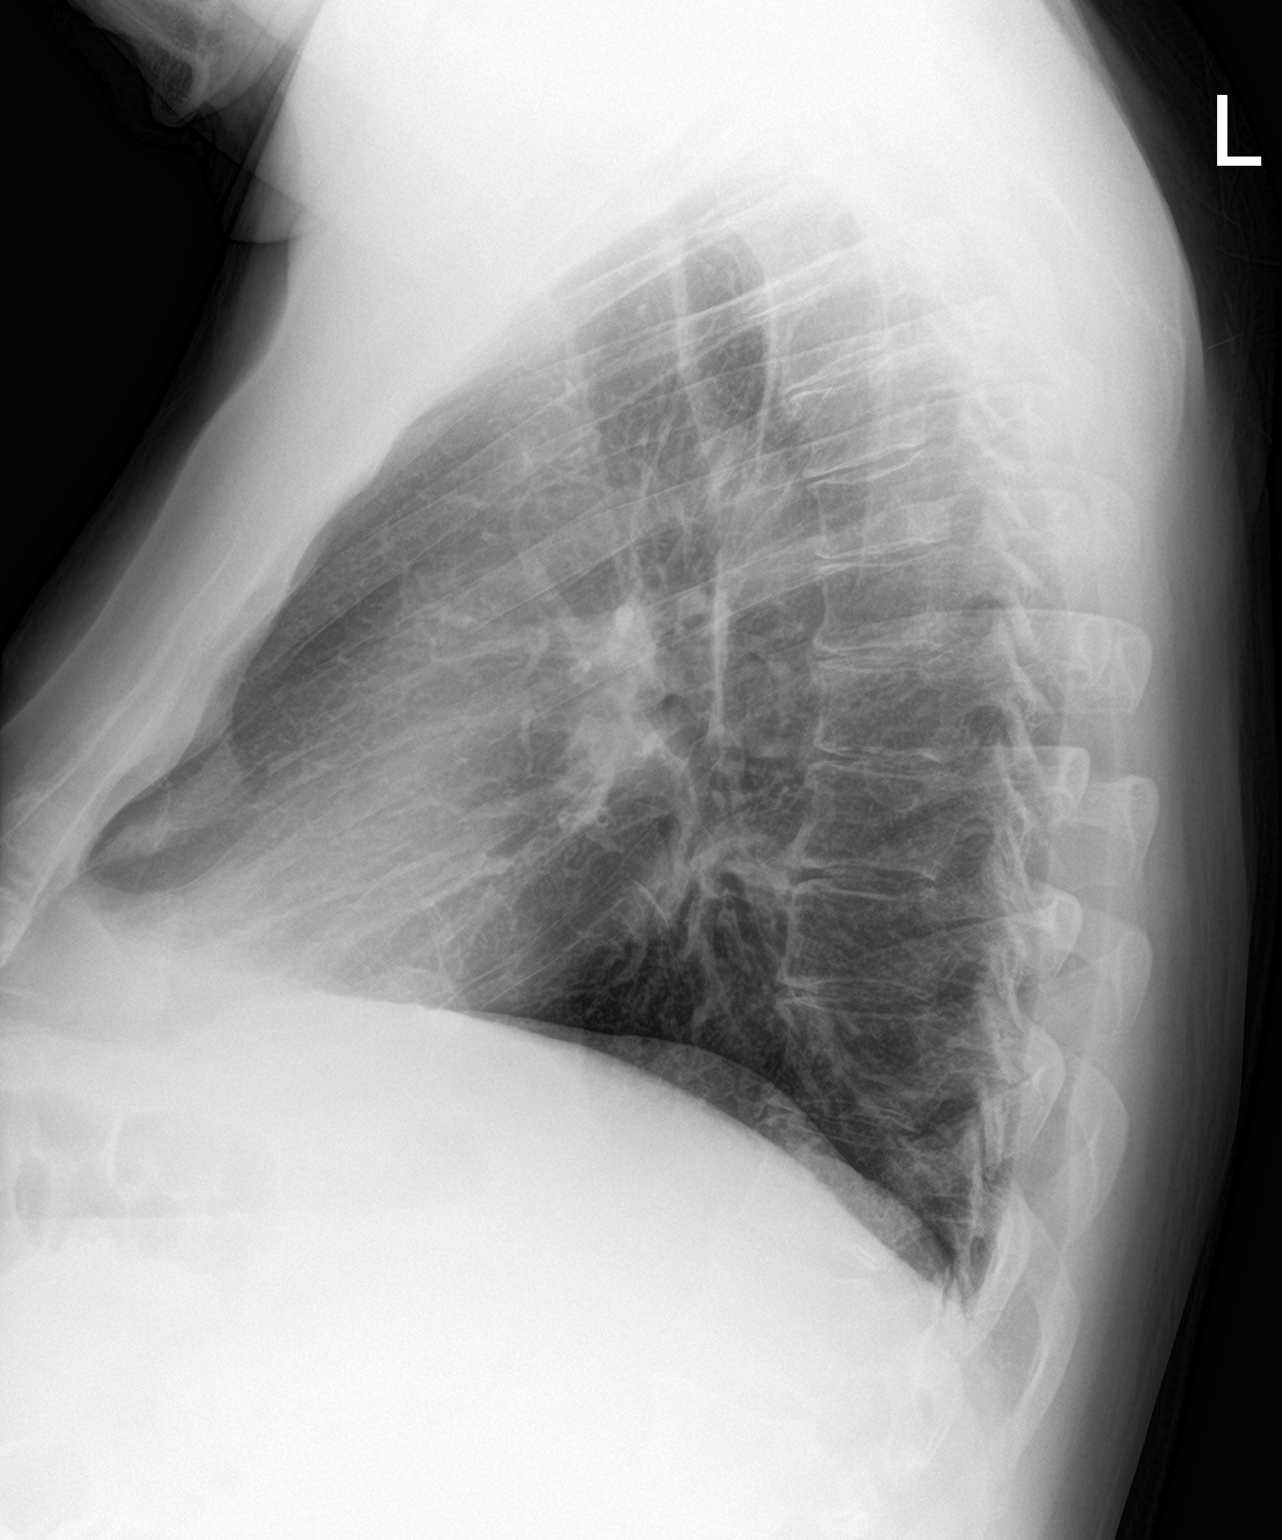

[2 of 2 positions shown; findings below may reference images not displayed]

FINDINGS: The heart size and mediastinal contours are within normal limits.
Both lungs are clear. The visualized skeletal structures are
unremarkable.
IMPRESSION: No active cardiopulmonary disease.

## 2024-02-15 ENCOUNTER — Other Ambulatory Visit
Admission: RE | Admit: 2024-02-15 | Discharge: 2024-02-15 | Disposition: A | Discharge status: Home / Self Care | Source: Ambulatory Visit | Attending: Family Medicine | Admitting: Family Medicine

## 2024-02-15 DIAGNOSIS — M25561 Pain in right knee: Secondary | ICD-10-CM | POA: Insufficient documentation

## 2024-02-15 LAB — BASIC METABOLIC PANEL WITH GFR
Anion gap: 8 (ref 5–15)
BUN: 14 mg/dL (ref 8–23)
CO2: 25 mmol/L (ref 22–32)
Calcium: 9.4 mg/dL (ref 8.9–10.3)
Chloride: 106 mmol/L (ref 98–111)
Creatinine, Ser: 1.45 mg/dL — ABNORMAL HIGH (ref 0.61–1.24)
GFR, Estimated: 53 mL/min — ABNORMAL LOW (ref >60)
Glucose, Bld: 90 mg/dL (ref 70–99)
Potassium: 4.1 mmol/L (ref 3.5–5.1)
Sodium: 139 mmol/L (ref 135–145)
